# Patient Record
Sex: Male | Born: 1975 | Race: Black or African American | Hispanic: No | Marital: Single | Smoking: Current every day smoker
Health system: Southern US, Community
[De-identification: ages and names within clinical notes are randomized; demographics above are authoritative.]

## PROBLEM LIST (undated history)

## (undated) HISTORY — PX: SHOULDER SURGERY: SHX246

---

## 1998-12-27 ENCOUNTER — Emergency Department (HOSPITAL_COMMUNITY): Admission: EM | Admit: 1998-12-27 | Discharge: 1998-12-27 | Payer: Self-pay | Admitting: Emergency Medicine

## 2004-01-30 ENCOUNTER — Emergency Department (HOSPITAL_COMMUNITY): Admission: EM | Admit: 2004-01-30 | Discharge: 2004-01-30 | Payer: Self-pay | Admitting: Emergency Medicine

## 2004-03-14 ENCOUNTER — Emergency Department (HOSPITAL_COMMUNITY): Admission: EM | Admit: 2004-03-14 | Discharge: 2004-03-14 | Payer: Self-pay | Admitting: *Deleted

## 2004-05-06 ENCOUNTER — Emergency Department (HOSPITAL_COMMUNITY): Admission: EM | Admit: 2004-05-06 | Discharge: 2004-05-06 | Payer: Self-pay | Admitting: Family Medicine

## 2005-01-29 ENCOUNTER — Emergency Department (HOSPITAL_COMMUNITY): Admission: EM | Admit: 2005-01-29 | Discharge: 2005-01-29 | Payer: Self-pay | Admitting: Emergency Medicine

## 2006-03-19 ENCOUNTER — Emergency Department (HOSPITAL_COMMUNITY): Admission: EM | Admit: 2006-03-19 | Discharge: 2006-03-19 | Payer: Self-pay | Admitting: Family Medicine

## 2006-05-07 ENCOUNTER — Emergency Department (HOSPITAL_COMMUNITY): Admission: EM | Admit: 2006-05-07 | Discharge: 2006-05-07 | Payer: Self-pay | Admitting: Family Medicine

## 2007-07-15 ENCOUNTER — Emergency Department (HOSPITAL_COMMUNITY): Admission: EM | Admit: 2007-07-15 | Discharge: 2007-07-15 | Payer: Self-pay | Admitting: Emergency Medicine

## 2008-05-24 ENCOUNTER — Emergency Department (HOSPITAL_COMMUNITY): Admission: EM | Admit: 2008-05-24 | Discharge: 2008-05-24 | Payer: Self-pay | Admitting: Family Medicine

## 2008-10-20 ENCOUNTER — Emergency Department (HOSPITAL_COMMUNITY): Admission: EM | Admit: 2008-10-20 | Discharge: 2008-10-20 | Payer: Self-pay | Admitting: Emergency Medicine

## 2008-12-06 ENCOUNTER — Emergency Department (HOSPITAL_COMMUNITY): Admission: EM | Admit: 2008-12-06 | Discharge: 2008-12-06 | Payer: Self-pay | Admitting: Family Medicine

## 2009-01-01 ENCOUNTER — Emergency Department (HOSPITAL_COMMUNITY): Admission: EM | Admit: 2009-01-01 | Discharge: 2009-01-01 | Payer: Self-pay | Admitting: Emergency Medicine

## 2009-01-25 ENCOUNTER — Emergency Department (HOSPITAL_COMMUNITY): Admission: EM | Admit: 2009-01-25 | Discharge: 2009-01-25 | Payer: Self-pay | Admitting: Emergency Medicine

## 2009-01-25 ENCOUNTER — Emergency Department (HOSPITAL_COMMUNITY): Admission: EM | Admit: 2009-01-25 | Discharge: 2009-01-25 | Payer: Self-pay | Admitting: Family Medicine

## 2009-03-14 ENCOUNTER — Emergency Department (HOSPITAL_COMMUNITY): Admission: EM | Admit: 2009-03-14 | Discharge: 2009-03-14 | Payer: Self-pay | Admitting: Family Medicine

## 2009-09-23 ENCOUNTER — Emergency Department (HOSPITAL_COMMUNITY): Admission: EM | Admit: 2009-09-23 | Discharge: 2009-09-23 | Payer: Self-pay | Admitting: Family Medicine

## 2009-10-03 ENCOUNTER — Emergency Department (HOSPITAL_COMMUNITY): Admission: EM | Admit: 2009-10-03 | Discharge: 2009-10-03 | Payer: Self-pay | Admitting: Emergency Medicine

## 2010-08-30 ENCOUNTER — Emergency Department (HOSPITAL_COMMUNITY)
Admission: EM | Admit: 2010-08-30 | Discharge: 2010-08-30 | Payer: Self-pay | Source: Home / Self Care | Admitting: Emergency Medicine

## 2010-10-14 ENCOUNTER — Emergency Department (HOSPITAL_COMMUNITY)
Admission: EM | Admit: 2010-10-14 | Discharge: 2010-10-14 | Disposition: A | Discharge status: Home / Self Care | Payer: Self-pay | Attending: Emergency Medicine | Admitting: Emergency Medicine

## 2010-10-14 DIAGNOSIS — K089 Disorder of teeth and supporting structures, unspecified: Secondary | ICD-10-CM | POA: Insufficient documentation

## 2010-10-17 ENCOUNTER — Emergency Department (HOSPITAL_COMMUNITY)
Admission: EM | Admit: 2010-10-17 | Discharge: 2010-10-18 | Disposition: A | Discharge status: Home / Self Care | Payer: Self-pay | Attending: Emergency Medicine | Admitting: Emergency Medicine

## 2010-10-17 DIAGNOSIS — K047 Periapical abscess without sinus: Secondary | ICD-10-CM | POA: Insufficient documentation

## 2010-10-17 DIAGNOSIS — K089 Disorder of teeth and supporting structures, unspecified: Secondary | ICD-10-CM | POA: Insufficient documentation

## 2010-10-25 LAB — POCT URINALYSIS DIP (DEVICE)
Glucose, UA: NEGATIVE mg/dL
Nitrite: NEGATIVE

## 2010-12-01 ENCOUNTER — Emergency Department (HOSPITAL_COMMUNITY)
Admission: EM | Admit: 2010-12-01 | Discharge: 2010-12-01 | Disposition: A | Discharge status: Home / Self Care | Payer: Self-pay | Attending: Emergency Medicine | Admitting: Emergency Medicine

## 2010-12-01 DIAGNOSIS — R197 Diarrhea, unspecified: Secondary | ICD-10-CM | POA: Insufficient documentation

## 2010-12-01 DIAGNOSIS — R109 Unspecified abdominal pain: Secondary | ICD-10-CM | POA: Insufficient documentation

## 2010-12-01 DIAGNOSIS — R112 Nausea with vomiting, unspecified: Secondary | ICD-10-CM | POA: Insufficient documentation

## 2010-12-01 LAB — POCT I-STAT, CHEM 8
BUN: 24 mg/dL — ABNORMAL HIGH (ref 6–23)
Chloride: 108 mEq/L (ref 96–112)
Creatinine, Ser: 1 mg/dL (ref 0.4–1.5)
HCT: 49 % (ref 39.0–52.0)
Potassium: 4.7 mEq/L (ref 3.5–5.1)
TCO2: 21 mmol/L (ref 0–100)

## 2011-04-02 ENCOUNTER — Emergency Department (HOSPITAL_COMMUNITY)
Admission: EM | Admit: 2011-04-02 | Discharge: 2011-04-02 | Disposition: A | Discharge status: Home / Self Care | Payer: Self-pay | Attending: Emergency Medicine | Admitting: Emergency Medicine

## 2011-04-02 DIAGNOSIS — L02419 Cutaneous abscess of limb, unspecified: Secondary | ICD-10-CM | POA: Insufficient documentation

## 2011-04-02 DIAGNOSIS — M25469 Effusion, unspecified knee: Secondary | ICD-10-CM | POA: Insufficient documentation

## 2011-04-02 DIAGNOSIS — M25569 Pain in unspecified knee: Secondary | ICD-10-CM | POA: Insufficient documentation

## 2011-06-22 ENCOUNTER — Emergency Department (HOSPITAL_COMMUNITY)
Admission: EM | Admit: 2011-06-22 | Discharge: 2011-06-22 | Disposition: A | Discharge status: Home / Self Care | Payer: Self-pay | Attending: Emergency Medicine | Admitting: Emergency Medicine

## 2011-06-22 ENCOUNTER — Encounter: Payer: Self-pay | Admitting: *Deleted

## 2011-06-22 DIAGNOSIS — H9209 Otalgia, unspecified ear: Secondary | ICD-10-CM | POA: Insufficient documentation

## 2011-06-22 DIAGNOSIS — R599 Enlarged lymph nodes, unspecified: Secondary | ICD-10-CM | POA: Insufficient documentation

## 2011-06-22 DIAGNOSIS — R51 Headache: Secondary | ICD-10-CM | POA: Insufficient documentation

## 2011-06-22 DIAGNOSIS — H6693 Otitis media, unspecified, bilateral: Secondary | ICD-10-CM

## 2011-06-22 DIAGNOSIS — H6690 Otitis media, unspecified, unspecified ear: Secondary | ICD-10-CM | POA: Insufficient documentation

## 2011-06-22 DIAGNOSIS — R590 Localized enlarged lymph nodes: Secondary | ICD-10-CM

## 2011-06-22 DIAGNOSIS — J3489 Other specified disorders of nose and nasal sinuses: Secondary | ICD-10-CM | POA: Insufficient documentation

## 2011-06-22 MED ORDER — HYDROCODONE-ACETAMINOPHEN 5-325 MG PO TABS: 2.0000 | ORAL_TABLET | ORAL | Status: AC | PRN

## 2011-06-22 MED ORDER — IBUPROFEN 800 MG PO TABS
800.0000 mg | ORAL_TABLET | Freq: Once | ORAL | Status: AC
  Administered 2011-06-22: 800 mg via ORAL
  Filled 2011-06-22: qty 1

## 2011-06-22 MED ORDER — AMOXICILLIN 500 MG PO CAPS: 500.0000 mg | ORAL_CAPSULE | Freq: Three times a day (TID) | ORAL | Status: AC

## 2011-06-22 MED ORDER — IBUPROFEN 800 MG PO TABS: 800.0000 mg | ORAL_TABLET | Freq: Three times a day (TID) | ORAL | Status: AC | PRN

## 2011-06-22 MED ORDER — HYDROCODONE-ACETAMINOPHEN 5-325 MG PO TABS
2.0000 | ORAL_TABLET | Freq: Once | ORAL | Status: AC
  Administered 2011-06-22: 1 via ORAL
  Filled 2011-06-22 (×2): qty 1

## 2011-06-22 NOTE — ED Provider Notes (Signed)
History     CSN: [id number] Arrival date & time: 06/22/2011 10:07 AM   First MD Initiated Contact with Patient 06/22/11 1109      Chief Complaint  Patient presents with  . Headache    (Consider location/radiation/quality/duration/timing/severity/associated sxs/prior treatment) HPI Comments: The patient is a 35 year old male without significant history of headaches who presents for pain bilaterally at the occiput/posterior regular lymph nodes. He has apparent lymphadenopathy of the postauricular lymph nodes, and when I palpate the lymph nodes, he notes that that is the exact location of the pain and palpation reproduces pain. He has no associated muscle spasticity of the neck, no limited range of motion of the neck, no nuchal rigidity or meningismus. He notes that he has had an upper respiratory infection with nasal congestion and bilateral ear congestion with bilateral ear pain for the last 2 days associated with this pain. No difficulty in swallowing, no change in voice. He has no neurologic deficits including no ataxia, no focal numbness or weakness, no change in vision, no change in hearing or tinnitus. He has no rash. His scalp shows no lesions or sign of infection.  Patient is a 35 y.o. male presenting with headaches. The history is provided by the patient.  Headache  This is a new problem. The current episode started 2 days ago. The problem occurs every few minutes. The problem has not changed since onset.Associated with: Nasal congestion, bilateral ear congestion, bilateral ear pain. No sore throat. No scalp lesions. The patient has apparent swollen postauricular lymph nodes at the area of pain. He describes the pain as sharp twinges. The pain is located in the occipital region. The quality of the pain is described as sharp. The pain is moderate. The pain does not radiate. Pertinent negatives include no anorexia, no fever, no malaise/fatigue, no chest pressure, no near-syncope, no  orthopnea, no palpitations, no syncope, no shortness of breath, no nausea and no vomiting. He has tried nothing for the symptoms.    History reviewed. No pertinent past medical history.  History reviewed. No pertinent past surgical history.  History reviewed. No pertinent family history.  History  Substance Use Topics  . Smoking status: Current Everyday Smoker    Types: Cigarettes  . Smokeless tobacco: Not on file  . Alcohol Use: Yes     occ      Review of Systems  Constitutional: Negative for fever, chills, malaise/fatigue, diaphoresis, activity change, appetite change, fatigue and unexpected weight change.  HENT: Positive for ear pain, congestion, rhinorrhea and postnasal drip. Negative for hearing loss, nosebleeds, sore throat, facial swelling, sneezing, mouth sores, trouble swallowing, neck pain, neck stiffness, dental problem, sinus pressure, tinnitus and ear discharge.   Eyes: Negative for photophobia, pain, discharge, redness, itching and visual disturbance.  Respiratory: Negative for cough, chest tightness and shortness of breath.   Cardiovascular: Negative for chest pain, palpitations, orthopnea, syncope and near-syncope.  Gastrointestinal: Negative for nausea, vomiting, abdominal pain, diarrhea and anorexia.  Genitourinary: Negative.   Musculoskeletal: Negative for myalgias and back pain.  Skin: Negative for color change, pallor, rash and wound.  Neurological: Positive for headaches. Negative for dizziness, tremors, seizures, syncope, facial asymmetry, speech difficulty, weakness, light-headedness and numbness.  Hematological: Positive for adenopathy. Does not bruise/bleed easily.       Posterior auricular lymphadenopathy  Psychiatric/Behavioral: Negative.     Allergies  Review of patient's allergies indicates no known allergies.  Home Medications   Current Outpatient Rx  Name Route Sig Dispense Refill  .  ACETAMINOPHEN 500 MG PO TABS Oral Take 1,000 mg by mouth  every 6 (six) hours as needed. As needed for pain.       BP 123/86  Pulse 64  Temp 98.1 F (36.7 C)  Resp 15  SpO2 95%  Physical Exam  Nursing note and vitals reviewed. Constitutional: He is oriented to person, place, and time. He appears well-developed and well-nourished. He appears distressed.  HENT:  Head: Normocephalic and atraumatic. Head is without raccoon's eyes, without Battle's sign, without abrasion, without contusion, without right periorbital erythema and without left periorbital erythema.  Right Ear: Hearing, external ear and ear canal normal. No drainage, swelling or tenderness. No foreign bodies. No mastoid tenderness. Tympanic membrane is injected and erythematous. Tympanic membrane is not scarred and not perforated. A middle ear effusion is present. No hemotympanum. No decreased hearing is noted.  Left Ear: Hearing, external ear and ear canal normal. No drainage, swelling or tenderness. No foreign bodies. No mastoid tenderness. Tympanic membrane is injected and erythematous. Tympanic membrane is not scarred and not perforated. A middle ear effusion is present. No hemotympanum. No decreased hearing is noted.  Nose: No mucosal edema, rhinorrhea, sinus tenderness, nasal deformity, septal deviation or nasal septal hematoma. No epistaxis. Right sinus exhibits no maxillary sinus tenderness and no frontal sinus tenderness. Left sinus exhibits no maxillary sinus tenderness and no frontal sinus tenderness.  Mouth/Throat: Uvula is midline, oropharynx is clear and moist and mucous membranes are normal. No oropharyngeal exudate.       Bilateral postauricular lymphadenopathy  Eyes: Conjunctivae, EOM and lids are normal. Pupils are equal, round, and reactive to light. Right eye exhibits no chemosis, no discharge, no exudate and no hordeolum. No foreign body present in the right eye. Left eye exhibits no chemosis, no discharge, no exudate and no hordeolum. No foreign body present in the left  eye. Right conjunctiva is not injected. Right conjunctiva has no hemorrhage. Left conjunctiva is not injected. Left conjunctiva has no hemorrhage. Right eye exhibits normal extraocular motion and no nystagmus. Left eye exhibits normal extraocular motion and no nystagmus.  Fundoscopic exam:      The right eye shows no AV nicking, no exudate, no hemorrhage and no papilledema.       The left eye shows no AV nicking, no exudate, no hemorrhage and no papilledema.  Neck: Trachea normal, normal range of motion, full passive range of motion without pain and phonation normal. Neck supple. No JVD present. No tracheal tenderness, no spinous process tenderness and no muscular tenderness present. Carotid bruit is not present. No rigidity. No tracheal deviation, no edema, no erythema and normal range of motion present. No Brudzinski's sign noted. No mass and no thyromegaly present.  Cardiovascular: Normal rate, regular rhythm, normal heart sounds and intact distal pulses.  Exam reveals no gallop and no friction rub.   No murmur heard. Pulmonary/Chest: Effort normal and breath sounds normal. No stridor. No respiratory distress. He has no wheezes. He has no rales.  Abdominal: Soft. Bowel sounds are normal. He exhibits no distension and no mass. There is no tenderness. There is no rebound and no guarding.  Musculoskeletal: Normal range of motion. He exhibits no edema and no tenderness.  Neurological: He is alert and oriented to person, place, and time. He has normal reflexes. He displays no atrophy, no tremor and normal reflexes. No cranial nerve deficit or sensory deficit. He exhibits normal muscle tone. He displays a negative Romberg sign. He displays no seizure activity. Coordination  and gait normal. GCS eye subscore is 4. GCS verbal subscore is 5. GCS motor subscore is 6.  Reflex Scores:      Tricep reflexes are 2+ on the right side and 2+ on the left side.      Bicep reflexes are 2+ on the right side and 2+ on the  left side.      Brachioradialis reflexes are 2+ on the right side and 2+ on the left side.      Patellar reflexes are 2+ on the right side and 2+ on the left side.      Achilles reflexes are 2+ on the right side and 2+ on the left side.      Normal visual fields to confrontation, normal coordination left finger-nose-finger and heel-to-shin test, normal gait, no cerebellar signs, nonfocal neurologic exam.  Skin: Skin is warm and dry. No rash noted. He is not diaphoretic. No erythema. No pallor.  Psychiatric: He has a normal mood and affect. His behavior is normal. Judgment and thought content normal.    ED Course  Procedures (including critical care time)  Labs Reviewed - No data to display No results found.   No diagnosis found.    MDM  The patient appears to be having bilateral otitis media with bilateral postauricular lymphadenopathy as the cause of his pain. There is no sign of mastoiditis, no sign of cervical spasm, and a nonfocal neurologic exam. CT scan of the brain is not indicated at this time.        Felisa Bonier, MD 06/22/11 [phone]

## 2011-06-22 NOTE — ED Notes (Signed)
Headache x 3 days, no vomiting.

## 2011-11-28 ENCOUNTER — Emergency Department (HOSPITAL_COMMUNITY)
Admission: EM | Admit: 2011-11-28 | Discharge: 2011-11-28 | Disposition: A | Discharge status: Home / Self Care | Payer: Self-pay | Source: Home / Self Care | Attending: Emergency Medicine | Admitting: Emergency Medicine

## 2011-11-28 ENCOUNTER — Encounter (HOSPITAL_COMMUNITY): Payer: Self-pay | Admitting: *Deleted

## 2011-11-28 DIAGNOSIS — T7840XA Allergy, unspecified, initial encounter: Secondary | ICD-10-CM

## 2011-11-28 MED ORDER — EPINEPHRINE 0.3 MG/0.3ML IJ DEVI: 0.3000 mg | Freq: Once | INTRAMUSCULAR | Status: AC

## 2011-11-28 MED ORDER — DIPHENHYDRAMINE HCL 50 MG/ML IJ SOLN
25.0000 mg | Freq: Once | INTRAMUSCULAR | Status: AC
  Administered 2011-11-28: 25 mg via INTRAMUSCULAR

## 2011-11-28 MED ORDER — ONDANSETRON 4 MG PO TBDP
4.0000 mg | ORAL_TABLET | Freq: Once | ORAL | Status: AC
  Administered 2011-11-28: 4 mg via ORAL

## 2011-11-28 MED ORDER — ONDANSETRON 4 MG PO TBDP
ORAL_TABLET | ORAL | Status: AC
  Filled 2011-11-28: qty 1

## 2011-11-28 MED ORDER — DIPHENHYDRAMINE HCL 50 MG/ML IJ SOLN
INTRAMUSCULAR | Status: AC
  Filled 2011-11-28: qty 1

## 2011-11-28 MED ORDER — PREDNISONE 5 MG PO KIT: 1.0000 | PACK | Freq: Every day | ORAL | Status: DC

## 2011-11-28 MED ORDER — METHYLPREDNISOLONE SODIUM SUCC 125 MG IJ SOLR
INTRAMUSCULAR | Status: AC
  Filled 2011-11-28: qty 2

## 2011-11-28 MED ORDER — METHYLPREDNISOLONE SODIUM SUCC 125 MG IJ SOLR
125.0000 mg | Freq: Once | INTRAMUSCULAR | Status: AC
  Administered 2011-11-28: 125 mg via INTRAMUSCULAR

## 2011-11-28 NOTE — ED Notes (Signed)
Pt with c/o allergic reaction onset approx 45 min ago immediately after eating granola bar - no known allergies - lips swollen - rash and itching all over - resp even and unlabored -

## 2011-11-28 NOTE — Discharge Instructions (Signed)
Do not eat peanuts or peanut containing products again--ever.  Food Allergy A food allergy occurs from eating something you are sensitive to. Food allergies occur in all age groups. It may be passed to you from your parents (heredity).  CAUSES  Some common causes are cow's milk, seafood, eggs, nuts (including peanut butter), wheat, and soybeans. SYMPTOMS  Common problems are:   Swelling around the mouth.   An itchy, red rash.   Hives.   Vomiting.   Diarrhea.  Severe allergic reactions are life-threatening. This reaction is called anaphylaxis. It can cause the mouth and throat to swell. This makes it hard to breathe and swallow. In severe reactions, only a small amount of food may be fatal within seconds. HOME CARE INSTRUCTIONS   If you are unsure what caused the reaction, keep a diary of foods eaten and symptoms that followed. Avoid foods that cause reactions.   If hives or rash are present:   Take medicines as directed.   Use an over-the-counter antihistamine (diphenhydramine) to treat hives and itching as needed.   Apply cold compresses to the skin or take baths in cool water. Avoid hot baths or showers. These will increase the redness and itching.   If you are severely allergic:   Hospitalization is often required following a severe reaction.   Wear a medical alert bracelet or necklace that describes the allergy.   Carry your anaphylaxis kit or epinephrine injection with you at all times. Both you and your family members should know how to use this. This can be lifesaving if you have a severe reaction. If epinephrine is used, it is important for you to seek immediate medical care or call your local emergency services (911 in [place] When the epinephrine wears off, it can be followed by a delayed reaction, which can be fatal.   Replace your epinephrine immediately after use in case of another reaction.   Ask your caregiver for instructions if you have not been taught how to  use an epinephrine injection.   Do not drive until medicines used to treat the reaction have worn off, unless approved by your caregiver.  SEEK MEDICAL CARE IF:   You suspect a food allergy. Symptoms generally happen within 30 minutes of eating a food.   Your symptoms have not gone away within 2 days. See your caregiver sooner if symptoms are getting worse.   You develop new symptoms.   You want to retest yourself with a food or drink you think causes an allergic reaction. Never do this if an anaphylactic reaction to that food or drink has happened before.   There is a return of the symptoms which brought you to your caregiver.  SEEK IMMEDIATE MEDICAL CARE IF:   You have trouble breathing, are wheezing, or you have a tight feeling in your chest or throat.   You have a swollen mouth, or you have hives, swelling, or itching all over your body. Use your epinephrine injection immediately. This is given into the outside of your thigh, deep into the muscle. Following use of the epinephrine injection, seek help right away.  Seek immediate medical care or call your local emergency services (911 in [place] MAKE SURE YOU:   Understand these instructions.   Will watch your condition.   Will get help right away if you are not doing well or get worse.  Document Released: 07/20/2000 Document Revised: 07/12/2011 Document Reviewed: 03/11/2008 [facility] Patient Information 2012 [place], [place].

## 2011-11-28 NOTE — ED Notes (Signed)
Pt up to bathroom gait steady feels better

## 2011-11-28 NOTE — ED Notes (Signed)
Family member with pt at bedside  Pt feeling better redness resolving itching resolving skin warm and dry

## 2011-11-28 NOTE — ED Notes (Signed)
HOB elevated pt feeling better gingerale given

## 2011-11-28 NOTE — ED Notes (Signed)
Pt became diaphoretic after injections - felt light headed - head of bed down feet up - bp 92/70 second bp 110/73

## 2011-11-28 NOTE — ED Notes (Signed)
Gave Pt Sm ice pack to place on injection site per [employer]

## 2011-11-28 NOTE — ED Provider Notes (Signed)
Chief Complaint  Patient presents with  . Allergic Reaction  . Rash  . Pruritis    History of Present Illness:   Rodney Hopkins is a 36 year old male who was well up until this afternoon around 2:25 PM when he ate a granola bar. Within minutes he broke out in a generalized erythematous rash, swelling of his face, generalized itching, he felt dizzy and weak. He denies any difficulty breathing, swelling of the tongue, the throat, wheezing, or shortness of breath. He's never had this happen to him before and he has no known allergies. His son has a peanut allergy and has similar symptoms when he eats peanuts.  Review of Systems:  Other than noted above, the patient denies any of the following symptoms. Systemic:  No fever, chills, sweats, fatigue, myalgias, headache, or anorexia. Eye:  No redness, itching, watering, pain or drainage. ENT:  No earache, ear congestion, nasal congestion, sneezing, nasal itching, rhinorrhea, sinus pressure, sinus pain, post nasal drip, or sore throat. Lungs:  No cough, sputum production, wheezing, shortness of breath, or chest pain. Skin:  No rash or itching.  PMFSH:  Past medical history, family history, social history, meds, and allergies were reviewed.  No history of asthma. No use of tobacco.  Physical Exam:   Vital signs:  BP 129/85  Pulse 89  Temp(Src) 98.1 F (36.7 C) (Oral)  Resp 16  SpO2 97% General:  Alert, in no respiratory distress. His arms are diffusely erythematous, his face with some swelling of his lips and around his left eye. There was no urticaria. Eye:  No conjunctival injection or drainage. Lids were normal. ENT:  TMs and canals were normal, without erythema or inflammation. Mucous membranes were moist.  Pharynx was clear, without exudate or drainage.  There were no oral ulcerations or lesions. Neck:  Supple, no adenopathy, tenderness or mass. Lungs:  No respiratory distress.  Lungs were clear to auscultation, without wheezes, rales or  rhonchi.  Breath sounds were clear and equal bilaterally. Heart:  Regular rhythm, without gallops, murmers or rubs. Skin:  Clear, warm, and dry, without rash or lesions. He has the erythematous rash as described above.  Course in Urgent Care Center:   He was given Benadryl 25 mg IM and Solu-Medrol 125 mg IM. Thereafter he had a vasovagal episode of presyncope. He described severe pain at the site of the Solu-Medrol injection. His allergic symptoms cleared up. His rash went away completely and for a while he did have a little bit of erythema of his arms after that but he had no further itching and no further facial swelling. He has not had any difficulty breathing.  Assessment:  The encounter diagnosis was Anaphylactic reaction due to food. This is most likely due to peanuts.  Plan:   1.  The following meds were prescribed:   New Prescriptions   EPINEPHRINE (EPI-PEN) 0.3 MG/0.3 ML DEVI    Inject 0.3 mLs (0.3 mg total) into the muscle once.   PREDNISONE 5 MG KIT    Take 1 kit (5 mg total) by mouth daily after breakfast. Prednisone 5 mg 6 day dosepack.  Take as directed.   2.  The patient was instructed in symptomatic care and handouts were given. 3.  The patient was told to return if becoming worse in any way, if no better in 3 or 4 days, and given some red flag symptoms that would indicate earlier return. The patient was told not seen any peanuts or peanuts containing foods or  products again ever. He was told to take Benadryl 25 mg every 4 hours while awake for the next 24 hours.  Follow up:  The patient was told to follow up here if any further problems occur.    Rodney Likes, MD 11/28/11 [phone]

## 2013-11-18 ENCOUNTER — Emergency Department (HOSPITAL_COMMUNITY)
Admission: EM | Admit: 2013-11-18 | Discharge: 2013-11-18 | Disposition: A | Discharge status: Home / Self Care | Payer: Self-pay | Attending: Emergency Medicine | Admitting: Emergency Medicine

## 2013-11-18 ENCOUNTER — Encounter (HOSPITAL_COMMUNITY): Payer: Self-pay | Admitting: Emergency Medicine

## 2013-11-18 DIAGNOSIS — F17200 Nicotine dependence, unspecified, uncomplicated: Secondary | ICD-10-CM | POA: Insufficient documentation

## 2013-11-18 DIAGNOSIS — K089 Disorder of teeth and supporting structures, unspecified: Secondary | ICD-10-CM | POA: Insufficient documentation

## 2013-11-18 DIAGNOSIS — K029 Dental caries, unspecified: Secondary | ICD-10-CM | POA: Insufficient documentation

## 2013-11-18 DIAGNOSIS — H9209 Otalgia, unspecified ear: Secondary | ICD-10-CM | POA: Insufficient documentation

## 2013-11-18 DIAGNOSIS — K0889 Other specified disorders of teeth and supporting structures: Secondary | ICD-10-CM

## 2013-11-18 MED ORDER — HYDROCODONE-ACETAMINOPHEN 5-325 MG PO TABS
1.0000 | ORAL_TABLET | Freq: Once | ORAL | Status: AC
  Administered 2013-11-18: 1 via ORAL
  Filled 2013-11-18: qty 1

## 2013-11-18 MED ORDER — HYDROCODONE-ACETAMINOPHEN 5-325 MG PO TABS: 1.0000 | ORAL_TABLET | Freq: Four times a day (QID) | ORAL | Status: DC | PRN

## 2013-11-18 MED ORDER — PENICILLIN V POTASSIUM 250 MG PO TABS
500.0000 mg | ORAL_TABLET | Freq: Four times a day (QID) | ORAL | Status: AC
Start: 2013-11-18 — End: 2013-11-25

## 2013-11-18 NOTE — ED Provider Notes (Signed)
CSN: [id number]     Arrival date & time 11/18/13  1333 History   First MD Initiated Contact with Patient 11/18/13 1544     Chief Complaint  Patient presents with  . Dental Pain     (Consider location/radiation/quality/duration/timing/severity/associated sxs/prior Treatment) The history is provided by the patient. No language interpreter was used.  Rodney Hopkins is a 38 y/o M with no significant PMHx presenting to the ED with dental pain that started yesterday. Patient reported that the pain is localized to the right lower jawline described as a constant throbbing sensation with radiation to the right ear. Patient reported that he has been using Tylenol and Tramadol with minimal relief. Reported that he has not been to the dentist in a while. Denied drainage, bleeding, fever, chills, numbness, tingling, visual changes, chest pain, shortness of breath, difficulty breathing, swelling, difficulty swallowing, sore throat.  PCP none   History reviewed. No pertinent past medical history. Past Surgical History  Procedure Laterality Date  . Shoulder surgery Left    No family history on file. History  Substance Use Topics  . Smoking status: Current Every Day Smoker  . Smokeless tobacco: Not on file  . Alcohol Use: No    Review of Systems  Constitutional: Negative for fever and chills.  HENT: Positive for dental problem and ear pain (right ear). Negative for trouble swallowing.   Eyes: Negative for visual disturbance.  Respiratory: Negative for chest tightness and shortness of breath.   Cardiovascular: Negative for chest pain.  Gastrointestinal: Negative for nausea and vomiting.  Musculoskeletal: Negative for neck pain.  Neurological: Negative for dizziness, speech difficulty, weakness and numbness.  All other systems reviewed and are negative.     Allergies  Review of patient's allergies indicates no known allergies.  Home Medications   Prior to Admission medications     Medication Sig Start Date End Date Taking? Authorizing Provider  acetaminophen (TYLENOL) 500 MG tablet Take 500 mg by mouth every 6 (six) hours as needed.   Yes Historical Provider, MD  HYDROcodone-acetaminophen (NORCO/VICODIN) 5-325 MG per tablet Take 1 tablet by mouth every 6 (six) hours as needed. 11/18/13   Brae Gartman, PA-C  penicillin v potassium (VEETID) 250 MG tablet Take 2 tablets (500 mg total) by mouth 4 (four) times daily. 11/18/13 11/25/13  Lyndel Sarate, PA-C   BP 141/113  Temp(Src) 97.9 F (36.6 C) (Oral)  Ht 5\' 7"  (1.702 m)  Wt 170 lb (77.111 kg)  BMI 26.62 kg/m2  SpO2 98% Physical Exam  Nursing note and vitals reviewed. Constitutional: He is oriented to person, place, and time. He appears well-developed and well-nourished. No distress.  HENT:  Head: Normocephalic and atraumatic.  Mouth/Throat: Oropharynx is clear and moist. No oral lesions. No trismus in the jaw. Dental caries present. No dental abscesses, uvula swelling or lacerations. No oropharyngeal exudate or tonsillar abscesses.    Poor dentition noted with numerous teeth diagrammed in missing. Numerous teeth in decaying process. Negative findings of periapical abscess. Negative trismus. Negative sublingual lesions. Uvula midline with symmetrical elevation.  Eyes: Conjunctivae and EOM are normal. Pupils are equal, round, and reactive to light. Right eye exhibits no discharge. Left eye exhibits no discharge.  Neck: Normal range of motion. Neck supple. No tracheal deviation present.  Cardiovascular: Normal rate, regular rhythm and normal heart sounds.  Exam reveals no friction rub.   No murmur heard. Pulmonary/Chest: Effort normal and breath sounds normal. No respiratory distress. He has no wheezes. He has no rales.  He exhibits no tenderness.  Musculoskeletal: Normal range of motion.  Full ROM to upper and lower extremities without difficulty noted, negative ataxia noted.  Lymphadenopathy:    He has no cervical  adenopathy.  Neurological: He is alert and oriented to person, place, and time. No cranial nerve deficit. He exhibits normal muscle tone. Coordination normal.  Cranial nerves III-XII grossly intact Strength 5+/5+ to upper and lower extremities bilaterally with resistance applied, equal distribution noted Negative facial drooping Negative slurred speech  Skin: Skin is warm and dry. No rash noted. He is not diaphoretic. No erythema.  Psychiatric: He has a normal mood and affect. His behavior is normal. Thought content normal.    ED Course  Procedures (including critical care time) Labs Review Labs Reviewed - No data to display  Imaging Review No results found.   EKG Interpretation None      MDM   Final diagnoses:  Pain, dental    Medications  HYDROcodone-acetaminophen (NORCO/VICODIN) 5-325 MG per tablet 1 tablet (not administered)   Filed Vitals:   11/18/13 1348  BP: 141/113  Temp: 97.9 F (36.6 C)  TempSrc: Oral  Height: 5\' 7"  (1.702 m)  Weight: 170 lb (77.111 kg)  SpO2: 98%   Patient presented to the ED with dental pain that started a couple of days ago localized to the right lower jawline scribed as a constant throbbing sensation with radiation to his right ear. Stated he's been using Tylenol and tramadol with minimal relief. Reported that he has not been to dentist for a while. Alert and oriented. GCS 15. Heart rate and rhythm normal. Lungs clear to auscultation to upper and lower lobes bilaterally-patient stable to speak in full sentences without difficulty, negative use of excess her muscles, negative stridor. Negative facial swelling identified. Poor dentition identified with numerous teeth in decaying and diagrammed. Most discomfort upon palpation to the right first molar. Negative swelling, erythema, drainage noted to the site. Negative trismus. Uvula midline with symmetrical elevation. Negative sublingual lesions. Doubt peritonsillar abscess. Doubt Ludwig's angina.  Doubt retropharyngeal abscess. Suspicion to be dental pain secondary to poor dentition. Pain medications administered in ED setting with relief. Patient stable, afebrile. Discharged patient. Discharge patient with antibiotics and pain medications as discussed course, cautious, disposal technique. Ref[facility] and dentist. Discussed with patient to closely monitor symptoms and if symptoms are to worsen or change to report back to the ED - strict return instructions given.  Patient agreed to plan of care, understood, all questions answered.     Raymon MuttonMarissa Deforrest Bogle, PA-C 11/18/13 1946

## 2013-11-18 NOTE — ED Notes (Signed)
Pt states he just noticed one of his molars was broken this morning and the right side of his face hurts.

## 2013-11-18 NOTE — ED Notes (Signed)
Pt states he used oragel and so the pain has eased up a little bit. States he is having sharp pains on the bottom of his jaw up through his face and head.

## 2013-11-18 NOTE — Discharge Instructions (Signed)
Please call your doctor for a followup appointment within 24-48 hours. When you talk to your doctor please let them know that you were seen in the emergency department and have them acquire all of your records so that they can discuss the findings with you and formulate a treatment plan to fully care for your new and ongoing problems. Please call and set-up an appointment with health and wellness center Please call and set -up an appointment with dentist to be re-assessed and evaluated Please take medications as prescribed - while on pain medications there is to be no drinking alcohol, driving, operating any heavy machinery. If extra please dispose in a proper manner Please do not take Tylenol for this can lead to tylenol overdose and liver failure Please continue to monitor symptoms closely and if symptoms are to worsen or change (fever greater than 101, chills, chest pain, shortness of breath, difficulty breathing, difficulty swallowing, inability to swallow, swelling to the face, bleeding, drainage, visual changes) please report back to the ED immediately   Dental Pain A tooth ache may be caused by cavities (tooth decay). Cavities expose the nerve of the tooth to air and hot or cold temperatures. It may come from an infection or abscess (also called a boil or furuncle) around your tooth. It is also often caused by dental caries (tooth decay). This causes the pain you are having. DIAGNOSIS  Your caregiver can diagnose this problem by exam. TREATMENT   If caused by an infection, it may be treated with medications which kill germs (antibiotics) and pain medications as prescribed by your caregiver. Take medications as directed.  Only take over-the-counter or prescription medicines for pain, discomfort, or fever as directed by your caregiver.  Whether the tooth ache today is caused by infection or dental disease, you should see your dentist as soon as possible for further care. SEEK MEDICAL CARE  IF: The exam and treatment you received today has been provided on an emergency basis only. This is not a substitute for complete medical or dental care. If your problem worsens or new problems (symptoms) appear, and you are unable to meet with your dentist, call or return to this location. SEEK IMMEDIATE MEDICAL CARE IF:   You have a fever.  You develop redness and swelling of your face, jaw, or neck.  You are unable to open your mouth.  You have severe pain uncontrolled by pain medicine. MAKE SURE YOU:   Understand these instructions.  Will watch your condition.  Will get help right away if you are not doing well or get worse. Document Released: 07/23/2005 Document Revised: 10/15/2011 Document Reviewed: 03/10/2008 [facility] Patient Information 2014 [place], [place].   Emergency Department Resource Guide 1) Find a Doctor and Pay Out of Pocket Although you won't have to find out who is covered by your insurance plan, it is a good idea to ask around and get recommendations. You will then need to call the office and see if the doctor you have chosen will accept you as a new patient and what types of options they offer for patients who are self-pay. Some doctors offer discounts or will set up payment plans for their patients who do not have insurance, but you will need to ask so you aren't surprised when you get to your appointment.  2) Contact Your Local Health Department Not all health departments have doctors that can see patients for sick visits, but many do, so it is worth a call to see if yours  does. If you don't know where your local health department is, you can check in your phone book. The CDC also has a tool to help you locate your state's health department, and many state websites also have listings of all of their local health departments.  3) Find a Walk-in Clinic If your illness is not likely to be very severe or complicated, you may want to try a walk in clinic. These are  popping up all over the country in pharmacies, drugstores, and shopping centers. They're usually staffed by nurse practitioners or physician assistants that have been trained to treat common illnesses and complaints. They're usually fairly quick and inexpensive. However, if you have serious medical issues or chronic medical problems, these are probably not your best option.  No Primary Care Doctor: - Call Health Connect at  [phone] - they can help you locate a primary care doctor that  accepts your insurance, provides certain services, etc. - Physician Referral Service- [phone]  Chronic Pain Problems: Organization         Address  Phone   Notes  [facility]  [phone] Patients need to be referred by their primary care doctor.   Medication Assistance: Organization         Address  Phone   Notes  [facility] Medication [facility] [address] [place] [place], [place] [address] [phone] --Must be a resident of [facility] -- Must have NO insurance coverage whatsoever (no Medicaid/ Medicare, etc.) -- The pt. MUST have a primary care doctor that directs their care regularly and follows them in the community   [facility]ist  [phone]   [employer]  [phone]    Agencies that provide inexpensive medical care: Organization         Address  Phone   Notes  [facility]  [phone]   [facility]    [phone]   [facility] [address] [place], [place] [address] [phone]   [facility] [address] [place] [place] [phone]   Planned Parenthood    [phone]   [facility]    [phone]   [facility] and [facility]  [address], [place] Phone:  [phone], Fax:  [phone] Hours of Operation:  9 am - 6 pm, M-F.  Also accepts Medicaid/Medicare and self-pay.  [facility]  [address], [place] Phone: [phone], Fax: [phone]. Hours of Operation:  8:30 am - 5:30 pm, M-F.  Also accepts Medicaid and self-pay.  [facility] [place] [address] [place] Phone: [phone]   [facility] [address] Natasha Bence [place], [place] [phone], Ext. [phone] Mondays & Thursdays: 7-9 AM.  First 15 patients are seen on a first come, first serve basis.    Medicaid-accepting [facility] Providers:  Organization         Address  Phone   Notes  [facility] [address],  [phone] Also accepts self-pay patients.  [facility] [address] Laurell Josephs [place], [place]  [phone]   [facility] [address], [place] [phone]   [facility] [address] [place] [phone]   Renaye Rakers [address], [place]   [phone]  Only accepts [place] Access Medicaid patients after they have their name applied to their card.   Self-Pay (no insurance) in [facility]:  Organization         Address  Phone   Notes  Sickle Cell Patients, [facility] [address], [place] [phone]) [phone]   [facility] [address], [place] [phone]   [facility]  [address], [place] [phone]) [phone]   [facility]/Dr. Osei-Bonsu  [address] [place] [address], [place] or [phone], [place] [phone]) [phone] Phone number for both [place] and [place] locations is the same.  [facility] [address], [place] [phone]) [phone]   [facility] [place] [address] [place] [address], [place] or [address] [phone]   [facility] [address], [place] [phone]) [phone], phone; [phone]) [phone], fax Sees patients 1st and 3rd Saturday  of every month.  Must not qualify for public or private insurance (i.e. Medicaid, Medicare, [place] Health Choice, Veterans' Benefits)  Household income should be no more than 200% of the poverty level The clinic cannot treat you if you are pregnant or think you are pregnant  Sexually transmitted diseases are not treated at the clinic.    Dental Care: Organization         Address  Phone  Notes  [facility] [address], [place] [phone]) [phone] Accepts children up to age 36 who are enrolled in [place] or [place] Health Choice; pregnant women with a Medicaid card; and children who have applied for Medicaid or [place] Health Choice, but were declined, whose parents can pay a reduced fee at time of service.  [facility] [place]  [address], [place] [phone]) [phone] Accepts children up to age 36 who are enrolled in [place] or [place] Health Choice; pregnant women with a Medicaid card; and children who have applied for Medicaid or [place] Health Choice, but were declined, whose parents can pay a reduced fee at time of service.  [facility]  [address], [place] [phone]) [phone] Patients are seen by appointment only. Walk-ins are not accepted. [facility] will see patients 36 years of age and older. Monday - Tuesday (8am-5pm) Most Wednesdays (8:30-5pm) $30 per visit, cash only  [facility]  [address], [place] [phone]) [phone] Patients are seen by appointment only. Walk-ins are not accepted. [facility] will see patients 36 years of age and older. One Wednesday Evening (Monthly: Volunteer Based).  $30 per visit, cash only  [facility]  [phone]) [phone] for adults; Children under age 654, call Graduate Pediatric Dentistry at [phone]) [phone]. Children aged 584-14, please call [phone] to request a pediatric application.   Dental services are provided in all areas of dental care including fillings, crowns and bridges, complete and partial dentures, implants, gum treatment, root canals, and extractions. Preventive care is also provided. Treatment is provided to both adults and children. Patients are selected via a lottery and there is often a waiting list.   [facility] [address], [place]  [phone]) [phone] [email or web address]   [facility] [address], [place], [place] [phone], Ext. [phone] Second and Fourth Thursday of each month, opens at 6:30 AM; Clinic  ends at 9 AM.  Patients are seen on a first-come first-served basis, and a limited number are seen during each clinic.   [facility]  [address], [place], [place] [phone](336) [phone]   Eligibility Requirements You must have lived in [place], [place], or [place] counties for at least the last three months.   You cannot be eligible for state or federal sponsored National Cityhealthcare insurance, including CIGNAVeterans Administration, [place]icaid, or [employer]Medicare.   You generally cannot be eligible for healthcare insurance through your employer.    How to apply: Eligibility screenings are held every Tuesday and Wednesday afternoon from 1:00 pm until 4:00 pm. You do not need an appointment for the interview!  [facility] [address], [place], [place] [phone]   [facility]  [phone]   [facility]  [phone]   [facility]  [phone]    Behavioral Health Resources in the Community: Intensive Outpatient Programs Organization         Address  Phone  Notes  [facility] [address], [place], [place] [phone]   [facility] [address], [place], [place] [phone]   [facility] [address], [place], [place]  [phone]   [facility] [address],  [place], [place] [phone] or [phone]   Substance Abuse Resources Organization         Address  Phone  Notes  [facility]  [phone]   [facility]  [phone]   [facility]  [phone]   [facility]  [phone]   Residential & Outpatient Substance Abuse Program  [phone]   Psychological Services Organization         Address  Phone  Notes  [facility]  [phone]- [phone]   [facility]  [phone]- [phone]   [facility] [address], [place] [phone] or [phone]    Mobile Crisis Teams Organization         Address  Phone  Notes  [facility], Mobile Crisis Care Unit  [phone]   [facility]  [address] [place], [place] [phone]   Doristine LocksSharon DeEsch [address] [place] [phone]    Self-Help/Support Groups Organization         Address  Phone             Notes  [facility] [place] - variety of support groups  336- [phone] Call for more information  Narcotics Anonymous (NA), [facility] [address], [place]  2 meetings at this location   Statisticianesidential Treatment Programs Organization         Address  Phone  Notes  [facility] [address],    [place]  [phone]   [facility]  [address], [place] [phone], [place], [place] [phone]   [facility] [address], [place] [phone] Admissions: 8am-3pm M-F  [facility] [address]    [place], [place] [phone]   [facility] [address], [place], [place] [phone]   [facility] [address]  [place], [place] [phone]   [facility] - Intensive Outpatient [address], [place], [place] [phone]   [facility] ([facility] [address]  [place], [place]  [phone] or [phone]  [facility] ([facility]) [address] [place], [place] [phone] Accepts Medicaid  °[facility] [address]  °[place] 1-800-659-3381 Substance Abuse/Addiction Treatment  ° °[place] Behavioral Health Resources °Organization         Address  Phone  Notes  °[facility]  ([phone]) [phone]   °Julie Brannon, PhD [address] [employer], [place]   ([phone]) [phone] or (336) [phone]   °[facility]   [address] °[place], [place] ([phone]) [phone]   °[facility] [address], [place], [place] [phone]) [phone] Insurance/Medicaid/sponsorship through [facility]  °[facility] [address]                                    [place], [place] [phone]) [phone] Therapy/tele-psych/case  °[facility] [address]  ° [place], [place] [phone]) [phone]    °Dr. Arfeen  ([phone]   °[facility] [place]  [employer] [place] [facility] 1) [address], [place] °2) [address], [place] °3)  [address], [place] ([phone]) [phone] °(336) [phone] ° °([phone]) [phone]   °[place] Child Abuse Hotline (336) 342-1394 or (336) [phone] (After Hours)    ° ° ° °

## 2013-11-19 NOTE — ED Provider Notes (Signed)
Medical screening examination/treatment/procedure(s) were performed by non-physician practitioner and as supervising physician I was immediately available for consultation/collaboration.   EKG Interpretation None        Lisette Mancebo L Lenoir Facchini, MD 11/19/13 1427 

## 2014-06-26 ENCOUNTER — Emergency Department (HOSPITAL_COMMUNITY)
Admission: EM | Admit: 2014-06-26 | Discharge: 2014-06-26 | Disposition: A | Discharge status: Home / Self Care | Payer: Self-pay | Attending: Emergency Medicine | Admitting: Emergency Medicine

## 2014-06-26 ENCOUNTER — Encounter (HOSPITAL_COMMUNITY): Payer: Self-pay | Admitting: Nurse Practitioner

## 2014-06-26 ENCOUNTER — Emergency Department (HOSPITAL_COMMUNITY): Payer: Self-pay

## 2014-06-26 DIAGNOSIS — M79672 Pain in left foot: Secondary | ICD-10-CM

## 2014-06-26 DIAGNOSIS — Y9367 Activity, basketball: Secondary | ICD-10-CM | POA: Insufficient documentation

## 2014-06-26 DIAGNOSIS — Y92310 Basketball court as the place of occurrence of the external cause: Secondary | ICD-10-CM | POA: Insufficient documentation

## 2014-06-26 DIAGNOSIS — M25472 Effusion, left ankle: Secondary | ICD-10-CM

## 2014-06-26 DIAGNOSIS — T1490XA Injury, unspecified, initial encounter: Secondary | ICD-10-CM

## 2014-06-26 DIAGNOSIS — X58XXXA Exposure to other specified factors, initial encounter: Secondary | ICD-10-CM | POA: Insufficient documentation

## 2014-06-26 DIAGNOSIS — M25572 Pain in left ankle and joints of left foot: Secondary | ICD-10-CM

## 2014-06-26 DIAGNOSIS — Y998 Other external cause status: Secondary | ICD-10-CM | POA: Insufficient documentation

## 2014-06-26 DIAGNOSIS — S92352A Displaced fracture of fifth metatarsal bone, left foot, initial encounter for closed fracture: Secondary | ICD-10-CM | POA: Insufficient documentation

## 2014-06-26 DIAGNOSIS — Z720 Tobacco use: Secondary | ICD-10-CM | POA: Insufficient documentation

## 2014-06-26 DIAGNOSIS — S92212A Displaced fracture of cuboid bone of left foot, initial encounter for closed fracture: Secondary | ICD-10-CM | POA: Insufficient documentation

## 2014-06-26 MED ORDER — IBUPROFEN 200 MG PO TABS
400.0000 mg | ORAL_TABLET | Freq: Once | ORAL | Status: AC
  Administered 2014-06-26: 400 mg via ORAL
  Filled 2014-06-26: qty 2

## 2014-06-26 MED ORDER — MELOXICAM 7.5 MG PO TABS
7.5000 mg | ORAL_TABLET | Freq: Every day | ORAL | Status: AC
Start: 2014-06-26 — End: ?

## 2014-06-26 MED ORDER — HYDROCODONE-ACETAMINOPHEN 5-325 MG PO TABS: 1.0000 | ORAL_TABLET | Freq: Four times a day (QID) | ORAL | Status: DC | PRN

## 2014-06-26 MED ORDER — HYDROCODONE-ACETAMINOPHEN 5-325 MG PO TABS
2.0000 | ORAL_TABLET | Freq: Once | ORAL | Status: AC
  Administered 2014-06-26: 2 via ORAL
  Filled 2014-06-26: qty 2

## 2014-06-26 NOTE — ED Notes (Signed)
He was playing basketball this afternoon and hurt L ankle. Hes had severe pain since. Swelling is noted, cms intact.

## 2014-06-26 NOTE — ED Provider Notes (Signed)
CSN: [id number]     Arrival date & time 06/26/14  1744 History  This chart was scribed for non-physician practitioner working with Rodney MochaBlair Walden, MD by Elveria Risingimelie Horne, ED Scribe. This patient was seen in room TR07C/TR07C and the patient's care was started at 7:26 PM.   Chief Complaint  Patient presents with  . Ankle Pain   The history is provided by the patient. No language interpreter was used.   HPI Comments: Rodney Hopkins is a 38 y.o. male who presents to the Emergency Department with left ankle injury incurred this afternoon. Patient reports everting his ankle while playing basketball. Patient presents with severe aching, throbbing pain and swelling to the lateral aspect. Patient reports shooting pain extending up to his left hip following the injury that has since resolved. Patient is ambulatory with difficulty due to pain severity rating his pain 10/10. Patient's pain exacerbated with movement, ambulation and bearing weight. Patient denies previous injury.  No pain medication PTA.   History reviewed. No pertinent past medical history. Past Surgical History  Procedure Laterality Date  . Shoulder surgery Left    History reviewed. No pertinent family history. History  Substance Use Topics  . Smoking status: Current Every Day Smoker  . Smokeless tobacco: Not on file  . Alcohol Use: Yes     Comment: social    Review of Systems  Constitutional: Negative for fever and chills.  Musculoskeletal: Positive for joint swelling and arthralgias. Negative for gait problem.  Neurological: Negative for weakness and numbness.      Allergies  Review of patient's allergies indicates no known allergies.  Home Medications   Prior to Admission medications   Medication Sig Start Date End Date Taking? Authorizing Provider  HYDROcodone-acetaminophen (NORCO/VICODIN) 5-325 MG per tablet Take 1-2 tablets by mouth every 6 (six) hours as needed. 06/26/14   Rodney FinnerErin O'Malley, PA-C  meloxicam (MOBIC) 7.5  MG tablet Take 1 tablet (7.5 mg total) by mouth daily. Take 1-2 tabs once daily for 5 days, then daily as needed for pain 06/26/14   Rodney FinnerErin O'Malley, PA-C   Triage Vitals: BP 145/89 mmHg  Pulse 87  Temp(Src) 98.3 F (36.8 C)  Resp 18  Ht 5\' 7"  (1.702 m)  Wt 160 lb (72.576 kg)  BMI 25.05 kg/m2  SpO2 98% Physical Exam  Constitutional: He is oriented to person, place, and time. He appears well-developed and well-nourished. No distress.  HENT:  Head: Normocephalic and atraumatic.  Eyes: EOM are normal.  Neck: Neck supple. No tracheal deviation present.  Cardiovascular: Normal rate.   Pulmonary/Chest: Effort normal. No respiratory distress.  Musculoskeletal: Normal range of motion.  Left ankle significant edema to lateral malleolus extending to fifth metatarsal. Tender to touch on lateral aspect of ankle and foot. Limited ROM of ankle due to pain. Full ROM of all five toes. Pedal pulses are 2+.   Neurological: He is alert and oriented to person, place, and time.  Skin: Skin is warm and dry.  Skin intact. No ecchymosis or erythema.   Psychiatric: He has a normal mood and affect. His behavior is normal.  Nursing note and vitals reviewed.   ED Course  Procedures (including critical care time)  COORDINATION OF CARE: 7:26 PM- Discussed treatment plan with patient at bedside and patient agreed to plan.   Labs Review Labs Reviewed - No data to display  Imaging Review Dg Ankle Complete Left  06/26/2014   CLINICAL DATA:  Past wall injury, twisting injury  EXAM: LEFT ANKLE COMPLETE -  3+ VIEW  COMPARISON:  None.  FINDINGS: Ankle mortise intact. The talar dome is normal. No malleolar fracture. The calcaneus is normal.  Potential avulsion fracture at the base of the fifth metatarsal.  IMPRESSION: 1. No evidence of ankle fracture. 2. Potential avulsion fracture at the base of the fifth metatarsal. Recommend foot films.   Electronically Signed   By: Genevive BiStewart  Edmunds M.D.   On: 06/26/2014 20:47    Dg Foot Complete Left  06/26/2014   CLINICAL DATA:  Patient's playing basketball lung rolled left ankle. Swelling laterally.  EXAM: LEFT FOOT - COMPLETE 3+ VIEW  COMPARISON:  None.  FINDINGS: Avulsion fracture fragment demonstrated adjacent to the cuboidal bone. No displaced fractures are demonstrated in the left foot. Mild lateral soft tissue swelling.  IMPRESSION: Avulsion fracture fragment demonstrated adjacent to the cuboidal bone.   Electronically Signed   By: Burman NievesWilliam  Stevens M.D.   On: 06/26/2014 21:19     EKG Interpretation None      MDM   Final diagnoses:  Left foot pain  Cuboid fracture, left, closed, initial encounter  Pain and swelling of ankle, left  Sports injury   Pt presenting to ED with left ankle and foot pain with swelling after twisting it playing basketball. Foot is neurovascularly in tact. Plain films significant for avulsion fracture demonstrated adjacent to cuboidal bone, potential avulsion fracture at the base of 5th metatarsal. Will place pt in cam-walker boot and provide crutches for comfort.  Home care instructions and work note provided. Advised to call to f/u with Dr. Eulah PontMurphy, orthopedics, for recheck and continued management of fracture if not improving in 1-2 weeks. Pt verbalized understanding and agreement with tx plan.  I personally performed the services described in this documentation, which was scribed in my presence. The recorded information has been reviewed and is accurate.    Rodney Finnerrin O'Malley, PA-C 06/26/14 2144  Rodney MochaBlair Walden, MD 06/26/14 2151

## 2017-08-26 ENCOUNTER — Encounter (HOSPITAL_COMMUNITY): Payer: Self-pay | Admitting: Nurse Practitioner

## 2019-01-10 ENCOUNTER — Emergency Department (HOSPITAL_COMMUNITY)
Admission: EM | Admit: 2019-01-10 | Discharge: 2019-01-10 | Disposition: A | Discharge status: Home / Self Care | Payer: Self-pay | Attending: Emergency Medicine | Admitting: Emergency Medicine

## 2019-01-10 ENCOUNTER — Other Ambulatory Visit: Payer: Self-pay

## 2019-01-10 ENCOUNTER — Encounter (HOSPITAL_COMMUNITY): Payer: Self-pay | Admitting: Emergency Medicine

## 2019-01-10 DIAGNOSIS — K047 Periapical abscess without sinus: Secondary | ICD-10-CM | POA: Insufficient documentation

## 2019-01-10 DIAGNOSIS — F17210 Nicotine dependence, cigarettes, uncomplicated: Secondary | ICD-10-CM | POA: Insufficient documentation

## 2019-01-10 DIAGNOSIS — K029 Dental caries, unspecified: Secondary | ICD-10-CM | POA: Insufficient documentation

## 2019-01-10 MED ORDER — AMOXICILLIN 500 MG PO CAPS
500.0000 mg | ORAL_CAPSULE | Freq: Once | ORAL | Status: AC
  Administered 2019-01-10: 500 mg via ORAL
  Filled 2019-01-10: qty 1

## 2019-01-10 MED ORDER — DICLOFENAC SODIUM 75 MG PO TBEC
75.0000 mg | DELAYED_RELEASE_TABLET | Freq: Once | ORAL | Status: AC
  Administered 2019-01-10: 75 mg via ORAL
  Filled 2019-01-10: qty 1

## 2019-01-10 MED ORDER — AMOXICILLIN 500 MG PO CAPS: 500.0000 mg | ORAL_CAPSULE | Freq: Three times a day (TID) | ORAL | 0 refills | Status: DC

## 2019-01-10 MED ORDER — DICLOFENAC SODIUM 75 MG PO TBEC: 75.0000 mg | DELAYED_RELEASE_TABLET | Freq: Two times a day (BID) | ORAL | 0 refills | Status: DC

## 2019-01-10 NOTE — Discharge Instructions (Addendum)
See the dentist as scheduled.  Return if any problems.

## 2019-01-10 NOTE — ED Provider Notes (Signed)
[facility] EMERGENCY DEPARTMENT Provider Note   CSN: [id number] Arrival date & time: 01/10/19  1722    History   Chief Complaint Chief Complaint  Patient presents with  . Oral Swelling    HPI Rodney Hopkins is a 43 y.o. male.     The history is provided by the patient. No language interpreter was used.  Dental Pain  Location:  Lower and upper Severity:  Moderate Onset quality:  Gradual Timing:  Constant Progression:  Worsening Chronicity:  New Relieved by:  Nothing Worsened by:  Nothing Ineffective treatments:  None tried Pt reports he has multiple bad teeth.  Pt complains of pain to the right side of his mouth   History reviewed. No pertinent past medical history.  There are no active problems to display for this patient.   Past Surgical History:  Procedure Laterality Date  . SHOULDER SURGERY Left         Home Medications    Prior to Admission medications   Medication Sig Start Date End Date Taking? Authorizing Provider  acetaminophen (TYLENOL) 500 MG tablet Take 1,000 mg by mouth every 6 (six) hours as needed. As needed for pain.     [provider]  amoxicillin (AMOXIL) 500 MG capsule Take 1 capsule (500 mg total) by mouth 3 (three) times daily. 01/10/19   Fransico Meadow, PA-C  diclofenac (VOLTAREN) 75 MG EC tablet Take 1 tablet (75 mg total) by mouth 2 (two) times daily. 01/10/19   Fransico Meadow, PA-C  EPINEPHrine (EPI-PEN) 0.3 mg/0.3 mL DEVI Inject 0.3 mLs (0.3 mg total) into the muscle once. 11/28/11   Harden Mo, MD  HYDROcodone-acetaminophen (NORCO/VICODIN) 5-325 MG per tablet Take 1-2 tablets by mouth every 6 (six) hours as needed. 06/26/14   Noe Gens, PA-C  meloxicam (MOBIC) 7.5 MG tablet Take 1 tablet (7.5 mg total) by mouth daily. Take 1-2 tabs once daily for 5 days, then daily as needed for pain 06/26/14   Noe Gens, PA-C  PredniSONE 5 MG KIT Take 1 kit (5 mg total) by mouth daily after breakfast.  Prednisone 5 mg 6 day dosepack.  Take as directed. 11/28/11   Harden Mo, MD    Family History History reviewed. No pertinent family history.  Social History Social History   Tobacco Use  . Smoking status: Current Every Day Smoker    Types: Cigarettes  Substance Use Topics  . Alcohol use: Yes    Comment: social  . Drug use: No     Allergies   Patient has no known allergies.   Review of Systems Review of Systems  HENT: Positive for dental problem.   All other systems reviewed and are negative.    Physical Exam Updated Vital Signs BP (!) 171/113 (BP Location: Right Arm)   Pulse 97   Temp 98 F (36.7 C) (Oral)   Resp 18   Ht _0  (1.676 m)   Wt 81.6 kg   SpO2 99%   BMI 29.05 kg/m   Physical Exam Vitals signs and nursing note reviewed.  HENT:     Head: Normocephalic.     Nose: Nose normal.     Mouth/Throat:     Comments: Severe dental decay,   Eyes:     Pupils: Pupils are equal, round, and reactive to light.  Neck:     Musculoskeletal: Normal range of motion.  Cardiovascular:     Rate and Rhythm: Normal rate.  Musculoskeletal: Normal  range of motion.  Skin:    General: Skin is warm.  Neurological:     General: No focal deficit present.     Mental Status: He is alert.  Psychiatric:        Mood and Affect: Mood normal.      ED Treatments / Results  Labs (all labs ordered are listed, but only abnormal results are displayed) Labs Reviewed - No data to display  EKG None  Radiology No results found.  Procedures Procedures (including critical care time)  Medications Ordered in ED Medications - No data to display   Initial Impression / Assessment and Plan / ED Course  I have reviewed the triage vital signs and the nursing notes.  Pertinent labs & imaging results that were available during my care of the patient were reviewed by me and considered in my medical decision making (see chart for details).        MDM   Pt reports he has  an appointment to see dentist.  Pt given rx for Voltaren and Amoxicillian   Final Clinical Impressions(s) / ED Diagnoses   Final diagnoses:  Dental infection    ED Discharge Orders         Ordered    amoxicillin (AMOXIL) 500 MG capsule  3 times daily     01/10/19 1801    diclofenac (VOLTAREN) 75 MG EC tablet  2 times daily     01/10/19 1801        An After Visit Summary was printed and given to the patient.    Fransico Meadow, [place] 01/10/19 1807    Little, Wenda Overland, MD 01/10/19 2125

## 2019-01-10 NOTE — ED Triage Notes (Signed)
Complaints of worsening dental pain x2 weeks. Pain is on upper and lower right side, radiating to right ear. Taking Tylenol, BC powders and Aleve with no relief. Denies fever/chills

## 2020-03-29 ENCOUNTER — Ambulatory Visit: Payer: Self-pay | Attending: Internal Medicine

## 2020-03-29 DIAGNOSIS — Z23 Encounter for immunization: Secondary | ICD-10-CM

## 2020-03-29 NOTE — Progress Notes (Signed)
   Covid-19 Vaccination Clinic  Name:  DORION PETILLO    MRN: [id number] DOB: [birth year]  03/29/2020  Mr. Drummonds was observed post Covid-19 immunization for 15 minutes without incident. He was provided with Vaccine Information Sheet and instruction to access the V-Safe system.   Mr. Mallozzi was instructed to call 911 with any severe reactions post vaccine: Marland Kitchen Difficulty breathing  . Swelling of face and throat  . A fast heartbeat  . A bad rash all over body  . Dizziness and weakness   Immunizations Administered    Name Date Dose VIS Date Route   Pfizer COVID-19 Vaccine 03/29/2020  2:17 PM 0.3 mL 09/30/2018 Intramuscular   Manufacturer: [employer], [employer]   Lot: [id number]   NDC: [address]-6

## 2020-04-26 ENCOUNTER — Ambulatory Visit: Payer: Self-pay

## 2020-06-18 ENCOUNTER — Ambulatory Visit: Payer: Self-pay

## 2020-11-10 ENCOUNTER — Emergency Department (HOSPITAL_COMMUNITY)
Admission: EM | Admit: 2020-11-10 | Discharge: 2020-11-10 | Disposition: A | Discharge status: Home / Self Care | Payer: Self-pay | Attending: Emergency Medicine | Admitting: Emergency Medicine

## 2020-11-10 ENCOUNTER — Emergency Department (HOSPITAL_COMMUNITY): Payer: Self-pay

## 2020-11-10 ENCOUNTER — Other Ambulatory Visit: Payer: Self-pay

## 2020-11-10 DIAGNOSIS — X58XXXA Exposure to other specified factors, initial encounter: Secondary | ICD-10-CM | POA: Insufficient documentation

## 2020-11-10 DIAGNOSIS — M25571 Pain in right ankle and joints of right foot: Secondary | ICD-10-CM | POA: Insufficient documentation

## 2020-11-10 DIAGNOSIS — Y9367 Activity, basketball: Secondary | ICD-10-CM | POA: Insufficient documentation

## 2020-11-10 DIAGNOSIS — F17210 Nicotine dependence, cigarettes, uncomplicated: Secondary | ICD-10-CM | POA: Insufficient documentation

## 2020-11-10 MED ORDER — KETOROLAC TROMETHAMINE 15 MG/ML IJ SOLN
15.0000 mg | Freq: Once | INTRAMUSCULAR | Status: AC
  Administered 2020-11-10: 15 mg via INTRAVENOUS
  Filled 2020-11-10: qty 1

## 2020-11-10 MED ORDER — ACETAMINOPHEN 500 MG PO TABS
1000.0000 mg | ORAL_TABLET | Freq: Once | ORAL | Status: AC
  Administered 2020-11-10: 1000 mg via ORAL
  Filled 2020-11-10: qty 2

## 2020-11-10 NOTE — ED Triage Notes (Signed)
Pt reports R ankle pain after injuring it while playing basketball yesterday afternoon after work.

## 2020-11-10 NOTE — ED Provider Notes (Signed)
MSE was initiated and I personally evaluated the patient and placed orders (if any) at  2:37 PM on November 10, 2020.  The patient appears stable so that the remainder of the MSE may be completed by another provider.   Anselm Pancoast, PA-C 11/10/20 1437    Gerhard Munch, MD 11/11/20 [phone]

## 2020-11-10 NOTE — ED Provider Notes (Signed)
[facility] EMERGENCY DEPARTMENT Provider Note   CSN: [id number] Arrival date & time: 11/10/20  1421     History Chief Complaint  Patient presents with  . Ankle Pain    Rodney Hopkins is a 45 y.o. male.  HPI Patient is a 45 y.o. male with a chief complaint of right ankle pain. Patient state he has a history of an injury to this foot in the past from a basketball injury and that he feels he pivoted on the foot to hard yesterday. He was able to bear weight initially but now is unable to due to swelling and pain. Denies fevers or chills, nausea or vomiting, syncope or SOB. Patient otherwise ambulatory prior to the event and in no acute distress at this time. Patient has not tried anything for symptom relief.   No past medical history on file.  There are no problems to display for this patient.   Past Surgical History:  Procedure Laterality Date  . SHOULDER SURGERY Left        No family history on file.  Social History   Tobacco Use  . Smoking status: Current Every Day Smoker    Types: Cigarettes  Substance Use Topics  . Alcohol use: Yes    Comment: social  . Drug use: No    Home Medications Prior to Admission medications   Medication Sig Start Date End Date Taking? Authorizing Provider  acetaminophen (TYLENOL) 500 MG tablet Take 1,000 mg by mouth every 6 (six) hours as needed. As needed for pain.     [provider]  amoxicillin (AMOXIL) 500 MG capsule Take 1 capsule (500 mg total) by mouth 3 (three) times daily. 01/10/19   Fransico Meadow, PA-C  diclofenac (VOLTAREN) 75 MG EC tablet Take 1 tablet (75 mg total) by mouth 2 (two) times daily. 01/10/19   Fransico Meadow, PA-C  EPINEPHrine (EPI-PEN) 0.3 mg/0.3 mL DEVI Inject 0.3 mLs (0.3 mg total) into the muscle once. 11/28/11   Harden Mo, MD  HYDROcodone-acetaminophen (NORCO/VICODIN) 5-325 MG per tablet Take 1-2 tablets by mouth every 6 (six) hours as needed. 06/26/14   Noe Gens, PA-C   meloxicam (MOBIC) 7.5 MG tablet Take 1 tablet (7.5 mg total) by mouth daily. Take 1-2 tabs once daily for 5 days, then daily as needed for pain 06/26/14   Noe Gens, PA-C  PredniSONE 5 MG KIT Take 1 kit (5 mg total) by mouth daily after breakfast. Prednisone 5 mg 6 day dosepack.  Take as directed. 11/28/11   Harden Mo, MD    Allergies    Patient has no known allergies.  Review of Systems   Review of Systems  Constitutional: Negative for chills and fever.  HENT: Negative for ear pain and sore throat.   Eyes: Negative for pain and visual disturbance.  Respiratory: Negative for cough and shortness of breath.   Cardiovascular: Negative for chest pain and palpitations.  Gastrointestinal: Negative for abdominal pain and vomiting.  Genitourinary: Negative for dysuria and hematuria.  Musculoskeletal: Positive for joint swelling. Negative for arthralgias and back pain.  Skin: Negative for color change and rash.  Neurological: Negative for seizures and syncope.  All other systems reviewed and are negative.   Physical Exam Updated Vital Signs BP (!) 144/104   Pulse 81   Temp 98.5 F (36.9 C)   Resp 16   SpO2 100%   Physical Exam Vitals and nursing note reviewed.  Constitutional:  Appearance: He is well-developed.  HENT:     Head: Normocephalic and atraumatic.  Eyes:     Conjunctiva/sclera: Conjunctivae normal.  Cardiovascular:     Rate and Rhythm: Normal rate and regular rhythm.     Heart sounds: No murmur heard.   Pulmonary:     Effort: Pulmonary effort is normal. No respiratory distress.     Breath sounds: Normal breath sounds.  Abdominal:     Palpations: Abdomen is soft.     Tenderness: There is no abdominal tenderness.  Musculoskeletal:        General: Swelling, tenderness and deformity present.     Cervical back: Neck supple.     Comments: Right ankle pain and swelling.  Skin:    General: Skin is warm and dry.  Neurological:     Mental Status: He is  alert.     ED Results / Procedures / Treatments   Labs (all labs ordered are listed, but only abnormal results are displayed) Labs Reviewed - No data to display  EKG None  Radiology DG Ankle Complete Right  Result Date: 11/10/2020 CLINICAL DATA:  Pain EXAM: RIGHT ANKLE - COMPLETE 3+ VIEW COMPARISON:  None. FINDINGS: Frontal, oblique, and lateral views were obtained. No acute fracture or joint effusion. No appreciable joint space narrowing or erosion. There are small foci of calcification within the ankle joint which may represent residua of prior trauma. Ankle mortise appears intact. IMPRESSION: No acute fracture. Small foci of calcification anteriorly and posteriorly in the ankle joint may represent residua of prior trauma. No joint space narrowing. Ankle mortise appears intact. Electronically Signed   By: Lowella Grip III M.D.   On: 11/10/2020 15:18   Medications Ordered in ED Medications - No data to display  ED Course  I have reviewed the triage vital signs and the nursing notes.  Pertinent labs & imaging results that were available during my care of the patient were reviewed by me and considered in my medical decision making (see chart for details).    MDM Rules/Calculators/A&P                          Patient is a 45 y.o. male presenting with ankle pain after an injury at basketball. XR demonstrates no acute abnormalities and the patient is neurovascularly at this time. On reassessment, patient able to ambulate with the assistance of crutches and is well appearing at this time.  Favor MSK sprain and patient encouraged to follow up with PCP if not improving.  Final Clinical Impression(s) / ED Diagnoses Final diagnoses:  None    Rx / DC Orders ED Discharge Orders    None       Tretha Sciara, MD 11/11/20 9021    Varney Biles, MD 11/12/20 [phone]

## 2020-12-31 ENCOUNTER — Emergency Department (HOSPITAL_COMMUNITY): Payer: Self-pay

## 2020-12-31 ENCOUNTER — Other Ambulatory Visit: Payer: Self-pay

## 2020-12-31 ENCOUNTER — Encounter (HOSPITAL_COMMUNITY): Payer: Self-pay | Admitting: Emergency Medicine

## 2020-12-31 ENCOUNTER — Emergency Department (HOSPITAL_COMMUNITY)
Admission: EM | Admit: 2020-12-31 | Discharge: 2020-12-31 | Disposition: A | Discharge status: Home / Self Care | Payer: Self-pay | Attending: Emergency Medicine | Admitting: Emergency Medicine

## 2020-12-31 DIAGNOSIS — R0602 Shortness of breath: Secondary | ICD-10-CM | POA: Insufficient documentation

## 2020-12-31 DIAGNOSIS — R0789 Other chest pain: Secondary | ICD-10-CM | POA: Insufficient documentation

## 2020-12-31 DIAGNOSIS — F1490 Cocaine use, unspecified, uncomplicated: Secondary | ICD-10-CM | POA: Insufficient documentation

## 2020-12-31 DIAGNOSIS — R202 Paresthesia of skin: Secondary | ICD-10-CM | POA: Insufficient documentation

## 2020-12-31 DIAGNOSIS — R079 Chest pain, unspecified: Secondary | ICD-10-CM

## 2020-12-31 DIAGNOSIS — F17210 Nicotine dependence, cigarettes, uncomplicated: Secondary | ICD-10-CM | POA: Insufficient documentation

## 2020-12-31 LAB — TROPONIN I (HIGH SENSITIVITY)
Troponin I (High Sensitivity): <2 ng/L (ref <18)
Troponin I (High Sensitivity): <2 ng/L (ref <18)

## 2020-12-31 LAB — CBC
HCT: 43.7 % (ref 39.0–52.0)
Hemoglobin: 15 g/dL (ref 13.0–17.0)
MCH: 31.6 pg (ref 26.0–34.0)
MCHC: 34.3 g/dL (ref 30.0–36.0)
MCV: 92.2 fL (ref 80.0–100.0)
Platelets: 235 10*3/uL (ref 150–400)
RBC: 4.74 MIL/uL (ref 4.22–5.81)
RDW: 12.5 % (ref 11.5–15.5)
WBC: 4.2 10*3/uL (ref 4.0–10.5)
nRBC: 0 % (ref 0.0–0.2)

## 2020-12-31 LAB — BASIC METABOLIC PANEL
Anion gap: 11 (ref 5–15)
BUN: 18 mg/dL (ref 6–20)
CO2: 23 mmol/L (ref 22–32)
Calcium: 9.6 mg/dL (ref 8.9–10.3)
Chloride: 105 mmol/L (ref 98–111)
Creatinine, Ser: 0.94 mg/dL (ref 0.61–1.24)
GFR, Estimated: >60 mL/min (ref >60)
Glucose, Bld: 88 mg/dL (ref 70–99)
Potassium: 4.2 mmol/L (ref 3.5–5.1)
Sodium: 139 mmol/L (ref 135–145)

## 2020-12-31 MED ORDER — ASPIRIN 81 MG PO CHEW
324.0000 mg | CHEWABLE_TABLET | Freq: Once | ORAL | Status: AC
  Administered 2020-12-31: 324 mg via ORAL
  Filled 2020-12-31: qty 4

## 2020-12-31 MED ORDER — NITROGLYCERIN 0.4 MG SL SUBL
0.4000 mg | SUBLINGUAL_TABLET | SUBLINGUAL | Status: AC | PRN
  Administered 2020-12-31 (×3): 0.4 mg via SUBLINGUAL
  Filled 2020-12-31 (×2): qty 1

## 2020-12-31 NOTE — ED Triage Notes (Signed)
C/o L sided chest pain x 5 days with dizziness.  Reports R arm numbness today.  Denies SOB, nausea, and vomiting.  Taking Pepcid without relief.

## 2020-12-31 NOTE — ED Notes (Signed)
Pt has left room and apparently left ED. MD notiffied

## 2020-12-31 NOTE — ED Provider Notes (Signed)
[facility] EMERGENCY DEPARTMENT Provider Note   CSN: [id number] Arrival date & time: 12/31/20  1426     History Chief Complaint  Patient presents with  . Chest Pain    Rodney Hopkins is a 45 y.o. male.  45 yo M who p/w CP. PT reports that 5d ago, he began having central, non-radiating CP that has been intermittent since it began. When asked if pain is sharp, squeezing, pressure, or tightness, patient states "all of them." Nothing makes pain better or worse, not associated w/ exertion. He reports feeling SOB when CP is happening. He had some R arm tingling on the way to the hospital.  He denies any cough/cold symptoms, nausea, vomiting, or abdominal pain.  He does admit to regular cocaine use, has been using cocaine over the past 5 days.  He took Pepcid without relief.  He smokes a few cigarettes per day.  No family history of heart disease.  No recent travel, leg swelling, or history of blood clots.  The history is provided by the patient.  Chest Pain      History reviewed. No pertinent past medical history.  There are no problems to display for this patient.   Past Surgical History:  Procedure Laterality Date  . SHOULDER SURGERY Left        No family history on file.  Social History   Tobacco Use  . Smoking status: Current Every Day Smoker    Types: Cigarettes  Substance Use Topics  . Alcohol use: Yes    Comment: social  . Drug use: No    Home Medications Prior to Admission medications   Medication Sig Start Date End Date Taking? Authorizing Provider  acetaminophen (TYLENOL) 500 MG tablet Take 1,000 mg by mouth every 6 (six) hours as needed. As needed for pain.   Yes [provider]  famotidine (PEPCID) 20 MG tablet Take 20 mg by mouth as needed for heartburn or indigestion.   Yes [provider]  amoxicillin (AMOXIL) 500 MG capsule Take 1 capsule (500 mg total) by mouth 3 (three) times daily. Patient not taking: Reported  on 12/31/2020 01/10/19   Fransico Meadow, PA-C  diclofenac (VOLTAREN) 75 MG EC tablet Take 1 tablet (75 mg total) by mouth 2 (two) times daily. Patient not taking: Reported on 12/31/2020 01/10/19   Fransico Meadow, PA-C  EPINEPHrine (EPI-PEN) 0.3 mg/0.3 mL DEVI Inject 0.3 mLs (0.3 mg total) into the muscle once. Patient not taking: Reported on 12/31/2020 11/28/11   Harden Mo, MD  HYDROcodone-acetaminophen (NORCO/VICODIN) 5-325 MG per tablet Take 1-2 tablets by mouth every 6 (six) hours as needed. Patient not taking: Reported on 12/31/2020 06/26/14   Noe Gens, PA-C  meloxicam (MOBIC) 7.5 MG tablet Take 1 tablet (7.5 mg total) by mouth daily. Take 1-2 tabs once daily for 5 days, then daily as needed for pain Patient not taking: Reported on 12/31/2020 06/26/14   Noe Gens, PA-C  PredniSONE 5 MG KIT Take 1 kit (5 mg total) by mouth daily after breakfast. Prednisone 5 mg 6 day dosepack.  Take as directed. Patient not taking: Reported on 12/31/2020 11/28/11   Harden Mo, MD    Allergies    Patient has no known allergies.  Review of Systems   Review of Systems  Cardiovascular: Positive for chest pain.   All other systems reviewed and are negative except that which was mentioned in HPI  Physical Exam Updated Vital Signs BP 106/61  Pulse 60   Resp 14   Ht _0  (1.676 m)   Wt 77.1 kg   SpO2 98%   BMI 27.44 kg/m   Physical Exam Constitutional:      General: He is not in acute distress.    Appearance: Normal appearance.  HENT:     Head: Normocephalic and atraumatic.  Eyes:     Conjunctiva/sclera: Conjunctivae normal.  Cardiovascular:     Rate and Rhythm: Normal rate and regular rhythm.     Heart sounds: Normal heart sounds. No murmur heard.   Pulmonary:     Effort: Pulmonary effort is normal.     Breath sounds: Normal breath sounds.  Abdominal:     General: Abdomen is flat. Bowel sounds are normal. There is no distension.     Palpations: Abdomen is soft.      Tenderness: There is no abdominal tenderness.  Musculoskeletal:     Right lower leg: No edema.     Left lower leg: No edema.  Skin:    General: Skin is warm and dry.  Neurological:     Mental Status: He is alert and oriented to person, place, and time.     Comments: fluent  Psychiatric:        Mood and Affect: Mood normal.        Behavior: Behavior normal.     ED Results / Procedures / Treatments   Labs (all labs ordered are listed, but only abnormal results are displayed) Labs Reviewed  BASIC METABOLIC PANEL  CBC  TROPONIN I (HIGH SENSITIVITY)  TROPONIN I (HIGH SENSITIVITY)    EKG EKG Interpretation  Date/Time:  Saturday Dec 31 2020 14:36:13 EDT Ventricular Rate:  94 PR Interval:  126 QRS Duration: 92 QT Interval:  344 QTC Calculation: 430 R Axis:   58 Text Interpretation: Normal sinus rhythm Minimal voltage criteria for LVH, may be normal variant ( Sokolow-Lyon ) Nonspecific ST and T wave abnormality Abnormal ECG noprevious tracing available Confirmed by Theotis Burrow [phone]) on 12/31/2020 3:30:28 PM   Radiology DG Chest 2 View  Result Date: 12/31/2020 CLINICAL DATA:  Chest pain. EXAM: CHEST - 2 VIEW COMPARISON:  None. FINDINGS: Cardiomediastinal silhouette is normal. Mediastinal contours appear intact. There is no evidence of focal airspace consolidation, pleural effusion or pneumothorax. Osseous structures are without acute abnormality. Postsurgical changes in the left chest wall. Punctate metallic densities also overlie the left thorax. IMPRESSION: No active cardiopulmonary disease. Electronically Signed   By: Fidela Salisbury M.D.   On: 12/31/2020 15:23    Procedures Procedures   Medications Ordered in ED Medications  aspirin chewable tablet 324 mg (324 mg Oral Given 12/31/20 1608)  nitroGLYCERIN (NITROSTAT) SL tablet 0.4 mg (0.4 mg Sublingual Given 12/31/20 1758)    ED Course  I have reviewed the triage vital signs and the nursing notes.  Pertinent  labs & imaging results that were available during my care of the patient were reviewed by me and considered in my medical decision making (see chart for details).    MDM Rules/Calculators/A&P                         Alert and well-appearing on exam, hypertensive but otherwise reassuring vital signs.  EKG with some nonspecific T wave changes inferiorly and in precordial leads, no previous available for comparison.  Chest x-ray clear.  Gave aspirin and nitroglycerin as I suspect he may be having cocaine induced chest pain/coronary vasospasm.  Recommended  screening lab work including serial troponins.  He has no risk factors for PE and is PERC negative.  His description is not highly suggestive of aortic dissection.  Lab work shows normal CBC and BMP, negative serial troponins.  I was later informed by the patient's nurse that he had at some point eloped prior to second troponin resulting. Final Clinical Impression(s) / ED Diagnoses Final diagnoses:  Nonspecific chest pain  Cocaine use    Rx / DC Orders ED Discharge Orders    None       Dezyrae Kensinger, Wenda Overland, MD 12/31/20 [phone]

## 2020-12-31 NOTE — ED Notes (Signed)
Pt reports minimal pain relief after 3 sublingual nitro.

## 2021-01-01 ENCOUNTER — Emergency Department (HOSPITAL_COMMUNITY): Payer: Self-pay

## 2021-01-01 ENCOUNTER — Emergency Department (HOSPITAL_COMMUNITY)
Admission: EM | Admit: 2021-01-01 | Discharge: 2021-01-01 | Disposition: A | Discharge status: Home / Self Care | Payer: Self-pay | Attending: Emergency Medicine | Admitting: Emergency Medicine

## 2021-01-01 ENCOUNTER — Encounter (HOSPITAL_COMMUNITY): Payer: Self-pay | Admitting: Emergency Medicine

## 2021-01-01 DIAGNOSIS — F1490 Cocaine use, unspecified, uncomplicated: Secondary | ICD-10-CM | POA: Diagnosis present

## 2021-01-01 DIAGNOSIS — R079 Chest pain, unspecified: Secondary | ICD-10-CM

## 2021-01-01 DIAGNOSIS — R0789 Other chest pain: Secondary | ICD-10-CM | POA: Insufficient documentation

## 2021-01-01 DIAGNOSIS — F17210 Nicotine dependence, cigarettes, uncomplicated: Secondary | ICD-10-CM | POA: Insufficient documentation

## 2021-01-01 DIAGNOSIS — Z789 Other specified health status: Secondary | ICD-10-CM | POA: Diagnosis present

## 2021-01-01 DIAGNOSIS — R202 Paresthesia of skin: Secondary | ICD-10-CM | POA: Insufficient documentation

## 2021-01-01 DIAGNOSIS — F1590 Other stimulant use, unspecified, uncomplicated: Secondary | ICD-10-CM | POA: Insufficient documentation

## 2021-01-01 LAB — CBC
HCT: 43.1 % (ref 39.0–52.0)
Hemoglobin: 14.5 g/dL (ref 13.0–17.0)
MCH: 31 pg (ref 26.0–34.0)
MCHC: 33.6 g/dL (ref 30.0–36.0)
MCV: 92.1 fL (ref 80.0–100.0)
Platelets: 240 10*3/uL (ref 150–400)
RBC: 4.68 MIL/uL (ref 4.22–5.81)
RDW: 12.3 % (ref 11.5–15.5)
WBC: 4.5 10*3/uL (ref 4.0–10.5)
nRBC: 0 % (ref 0.0–0.2)

## 2021-01-01 LAB — RAPID URINE DRUG SCREEN, HOSP PERFORMED
Amphetamines: NOT DETECTED
Barbiturates: POSITIVE — AB
Benzodiazepines: NOT DETECTED
Cocaine: POSITIVE — AB
Opiates: NOT DETECTED
Tetrahydrocannabinol: NOT DETECTED

## 2021-01-01 LAB — BASIC METABOLIC PANEL
Anion gap: 9 (ref 5–15)
BUN: 18 mg/dL (ref 6–20)
CO2: 27 mmol/L (ref 22–32)
Calcium: 9.3 mg/dL (ref 8.9–10.3)
Chloride: 102 mmol/L (ref 98–111)
Creatinine, Ser: 0.89 mg/dL (ref 0.61–1.24)
GFR, Estimated: >60 mL/min (ref >60)
Glucose, Bld: 147 mg/dL — ABNORMAL HIGH (ref 70–99)
Potassium: 4.1 mmol/L (ref 3.5–5.1)
Sodium: 138 mmol/L (ref 135–145)

## 2021-01-01 LAB — TROPONIN I (HIGH SENSITIVITY)
Troponin I (High Sensitivity): <2 ng/L (ref <18)
Troponin I (High Sensitivity): <2 ng/L (ref <18)

## 2021-01-01 MED ORDER — ALUM & MAG HYDROXIDE-SIMETH 200-200-20 MG/5ML PO SUSP
30.0000 mL | Freq: Once | ORAL | Status: AC
  Administered 2021-01-01: 30 mL via ORAL
  Filled 2021-01-01: qty 30

## 2021-01-01 MED ORDER — LIDOCAINE VISCOUS HCL 2 % MT SOLN
15.0000 mL | Freq: Once | OROMUCOSAL | Status: AC
  Administered 2021-01-01: 15 mL via ORAL
  Filled 2021-01-01: qty 15

## 2021-01-01 MED ORDER — CYCLOBENZAPRINE HCL 10 MG PO TABS: 10.0000 mg | ORAL_TABLET | Freq: Every evening | ORAL | 0 refills | Status: DC | PRN

## 2021-01-01 MED ORDER — SODIUM CHLORIDE 0.9 % IV BOLUS
500.0000 mL | Freq: Once | INTRAVENOUS | Status: AC
  Administered 2021-01-01: 500 mL via INTRAVENOUS

## 2021-01-01 MED ORDER — LORAZEPAM 2 MG/ML IJ SOLN
0.5000 mg | Freq: Once | INTRAMUSCULAR | Status: AC
  Administered 2021-01-01: 0.5 mg via INTRAVENOUS
  Filled 2021-01-01: qty 1

## 2021-01-01 MED ORDER — LIDOCAINE 5 % EX PTCH
1.0000 | MEDICATED_PATCH | CUTANEOUS | Status: DC
  Administered 2021-01-01: 1 via TRANSDERMAL
  Filled 2021-01-01: qty 1

## 2021-01-01 MED ORDER — IBUPROFEN 400 MG PO TABS
600.0000 mg | ORAL_TABLET | Freq: Once | ORAL | Status: AC
  Administered 2021-01-01: 600 mg via ORAL
  Filled 2021-01-01: qty 1

## 2021-01-01 NOTE — ED Provider Notes (Signed)
Emergency Medicine Provider Triage Evaluation Note  Rodney Hopkins , a 45 y.o. male  was evaluated in triage.  Pt complains of chest pain.  Review of Systems  Positive: Chest pain, sob Negative: Fever, productive cough  Physical Exam  BP (!) 144/104 (BP Location: Left Arm)   Pulse 86   Temp 98.1 F (36.7 C)   Resp 18   SpO2 100%  Gen:   Awake, no distress   Resp:  Normal effort  MSK:   Moves extremities without difficulty  Other:    Medical Decision Making  Medically screening exam initiated at 3:38 PM.  Appropriate orders placed.  Rodney Hopkins was informed that the remainder of the evaluation will be completed by another provider, this initial triage assessment does not replace that evaluation, and the importance of remaining in the ED until their evaluation is complete.  Was seen in the ER yesterday for CP, but eloped.  Pain still persist prompting return.    Rodney Helper, PA-C 01/01/21 1541    [name], MD 01/02/21 1003
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
## 2021-01-01 NOTE — ED Triage Notes (Signed)
C/o intermittent L sided chest pain x 6 days with dizziness.  Denies SOB, nausea, and vomiting.  Seen yesterday and LWBS.  Had been taking Pepcid without relief.

## 2021-01-01 NOTE — Discharge Instructions (Signed)
If your pain changes, becomes constant, you develop fevers, significant shortness of breath, leg swelling, or have any other concerns please seek additional medical care and evaluation.  Today you received medications that may make you sleepy or impair your ability to make decisions.  For the next 24 hours please do not drive, operate heavy machinery, care for a small child with out another adult present, or perform any activities that may cause harm to you or someone else if you were to fall asleep or be impaired.   You are being prescribed a medication which may make you sleepy. Please follow up of listed precautions for at least 24 hours after taking one dose.

## 2021-01-01 NOTE — ED Notes (Signed)
Patient verbalizes understanding of discharge instructions. Opportunity for questioning and answers were provided. Armband removed by staff, pt discharged from ED ambulatory.   

## 2021-01-01 NOTE — ED Notes (Signed)
PT given [place] sandwich bag

## 2022-07-07 IMAGING — CR DG ANKLE COMPLETE 3+V*R*
3 series · 3 of 3 positions shown · non-contrast
Comparison: None.

CLINICAL DATA: Pain

EXAM:
RIGHT ANKLE - COMPLETE 3+ VIEW

[ankle ap]
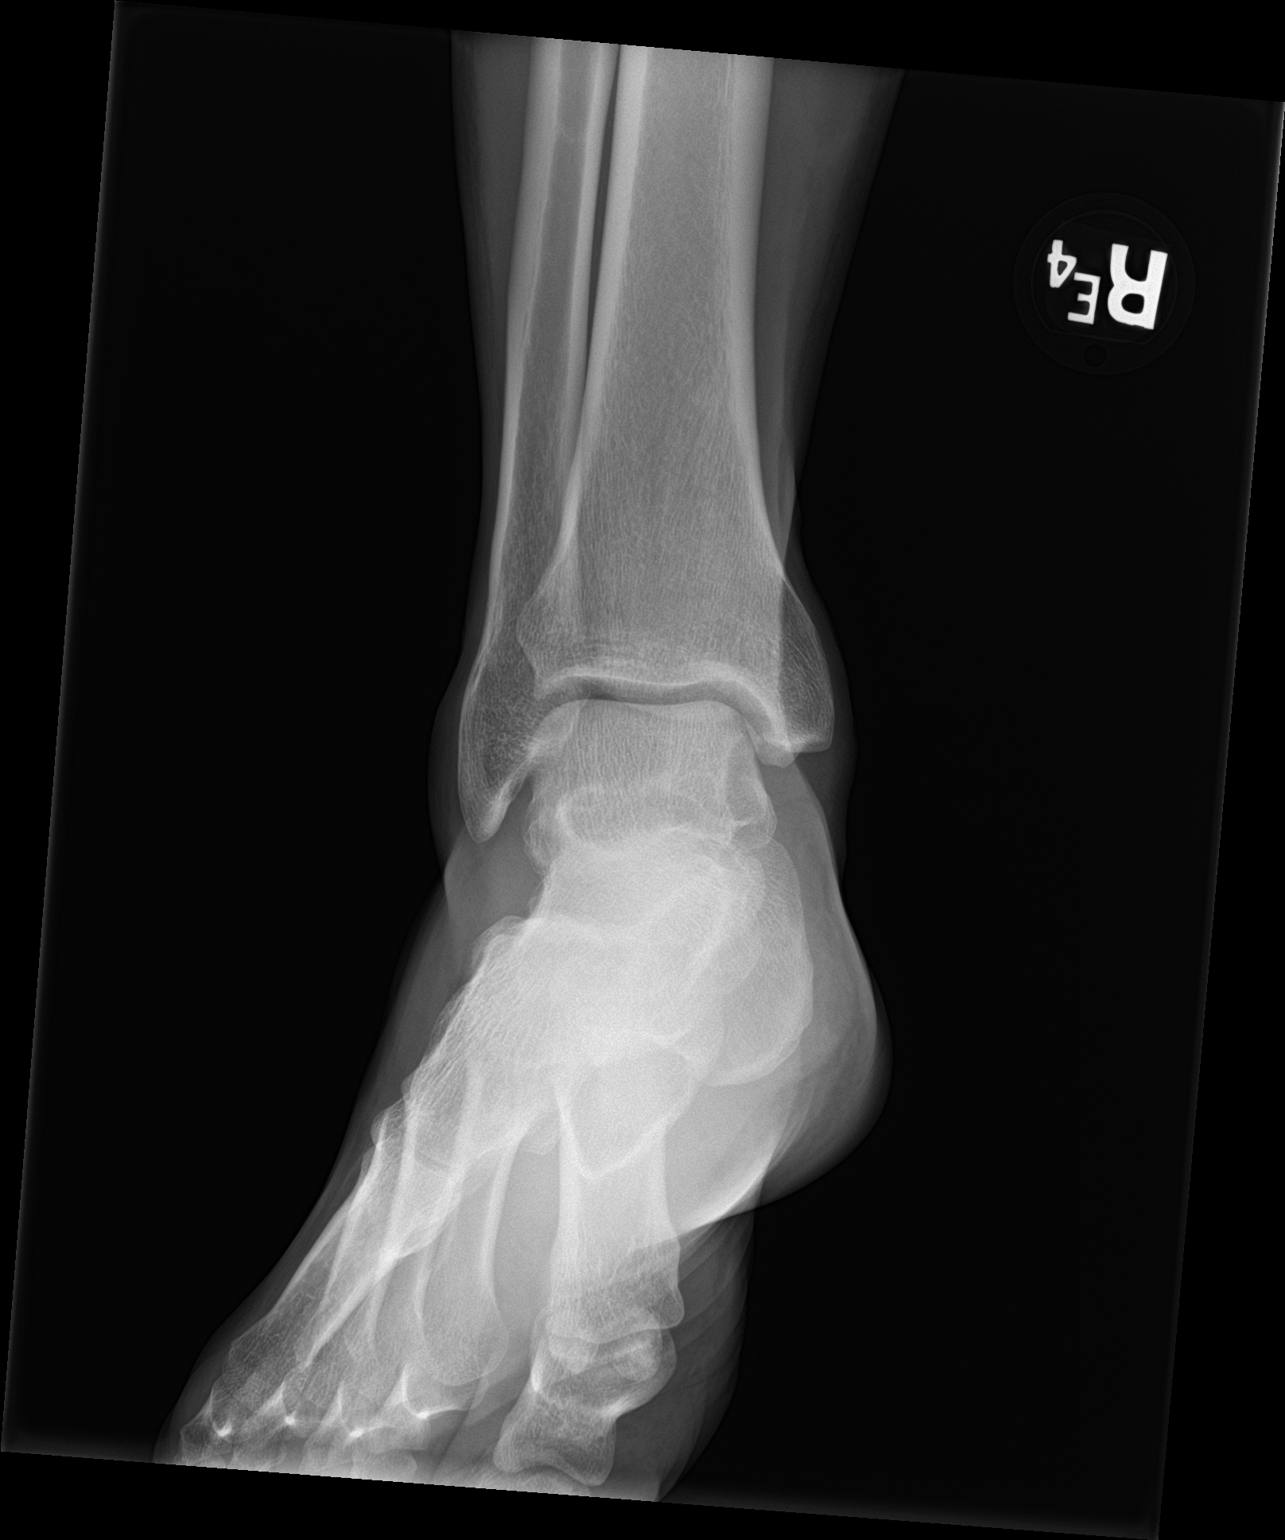

[ankle obl]
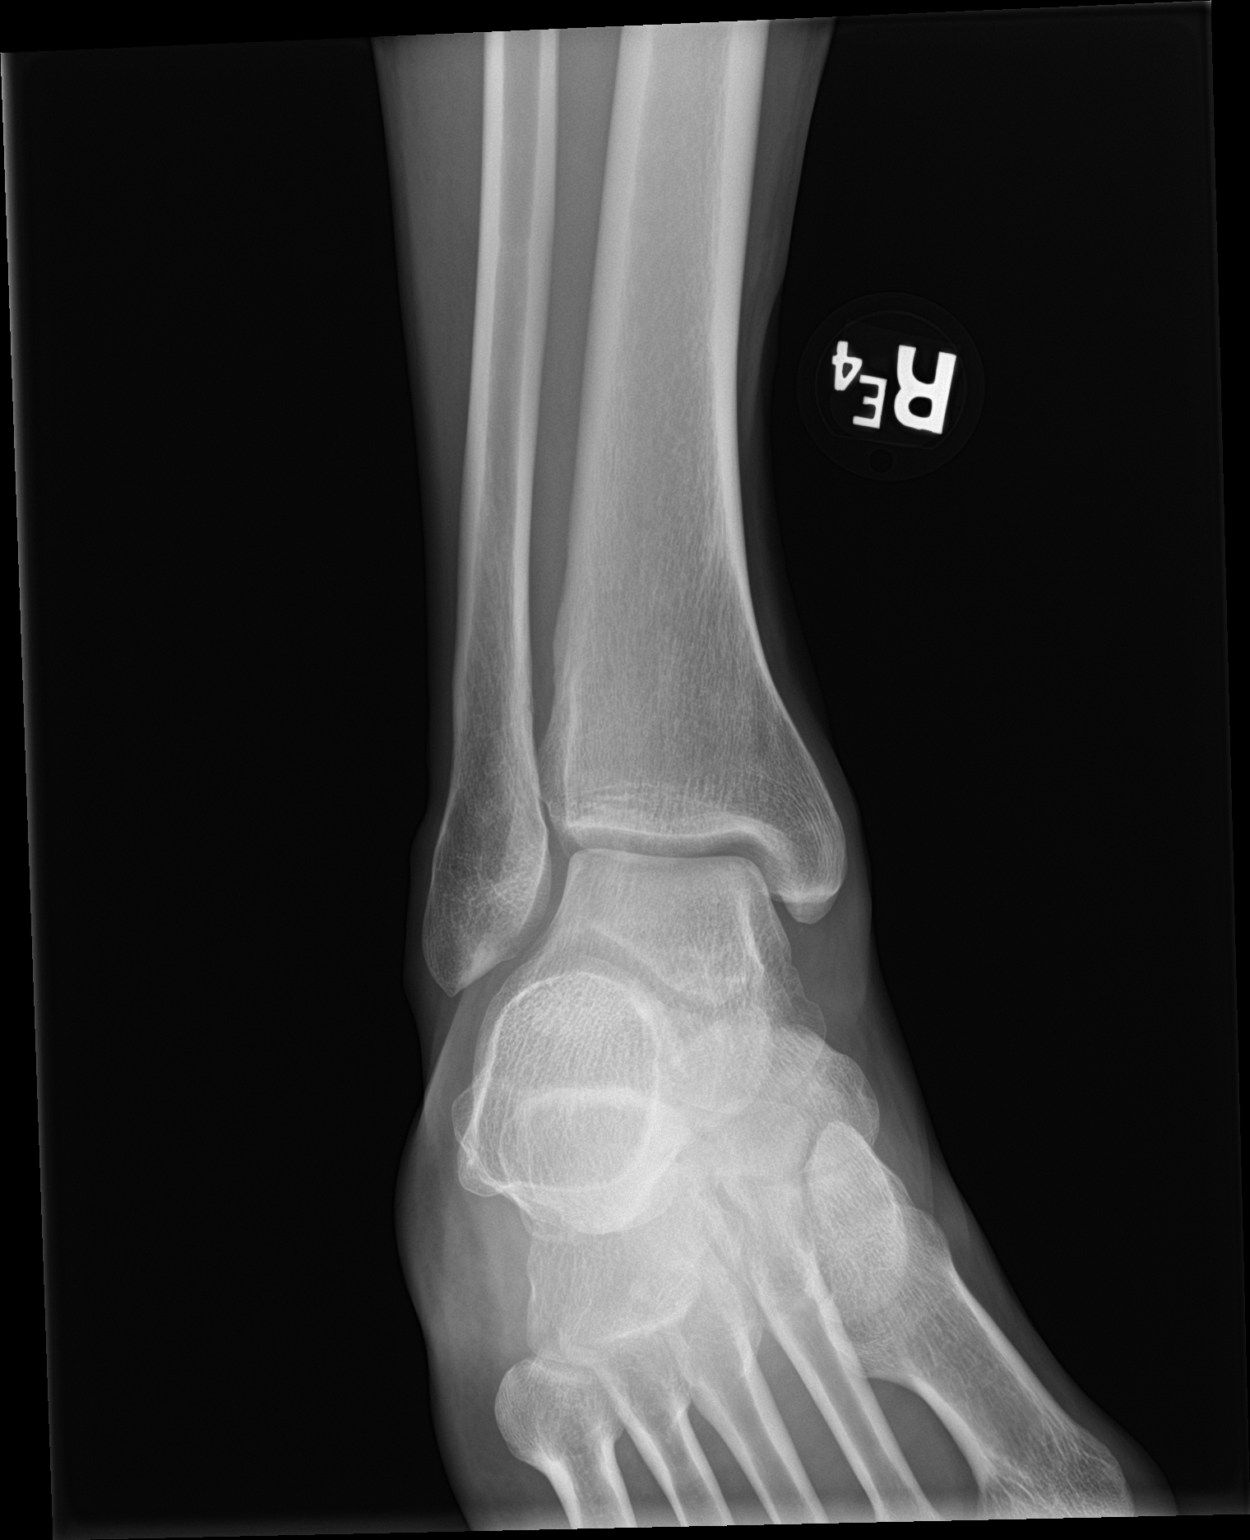

[ankle lat]
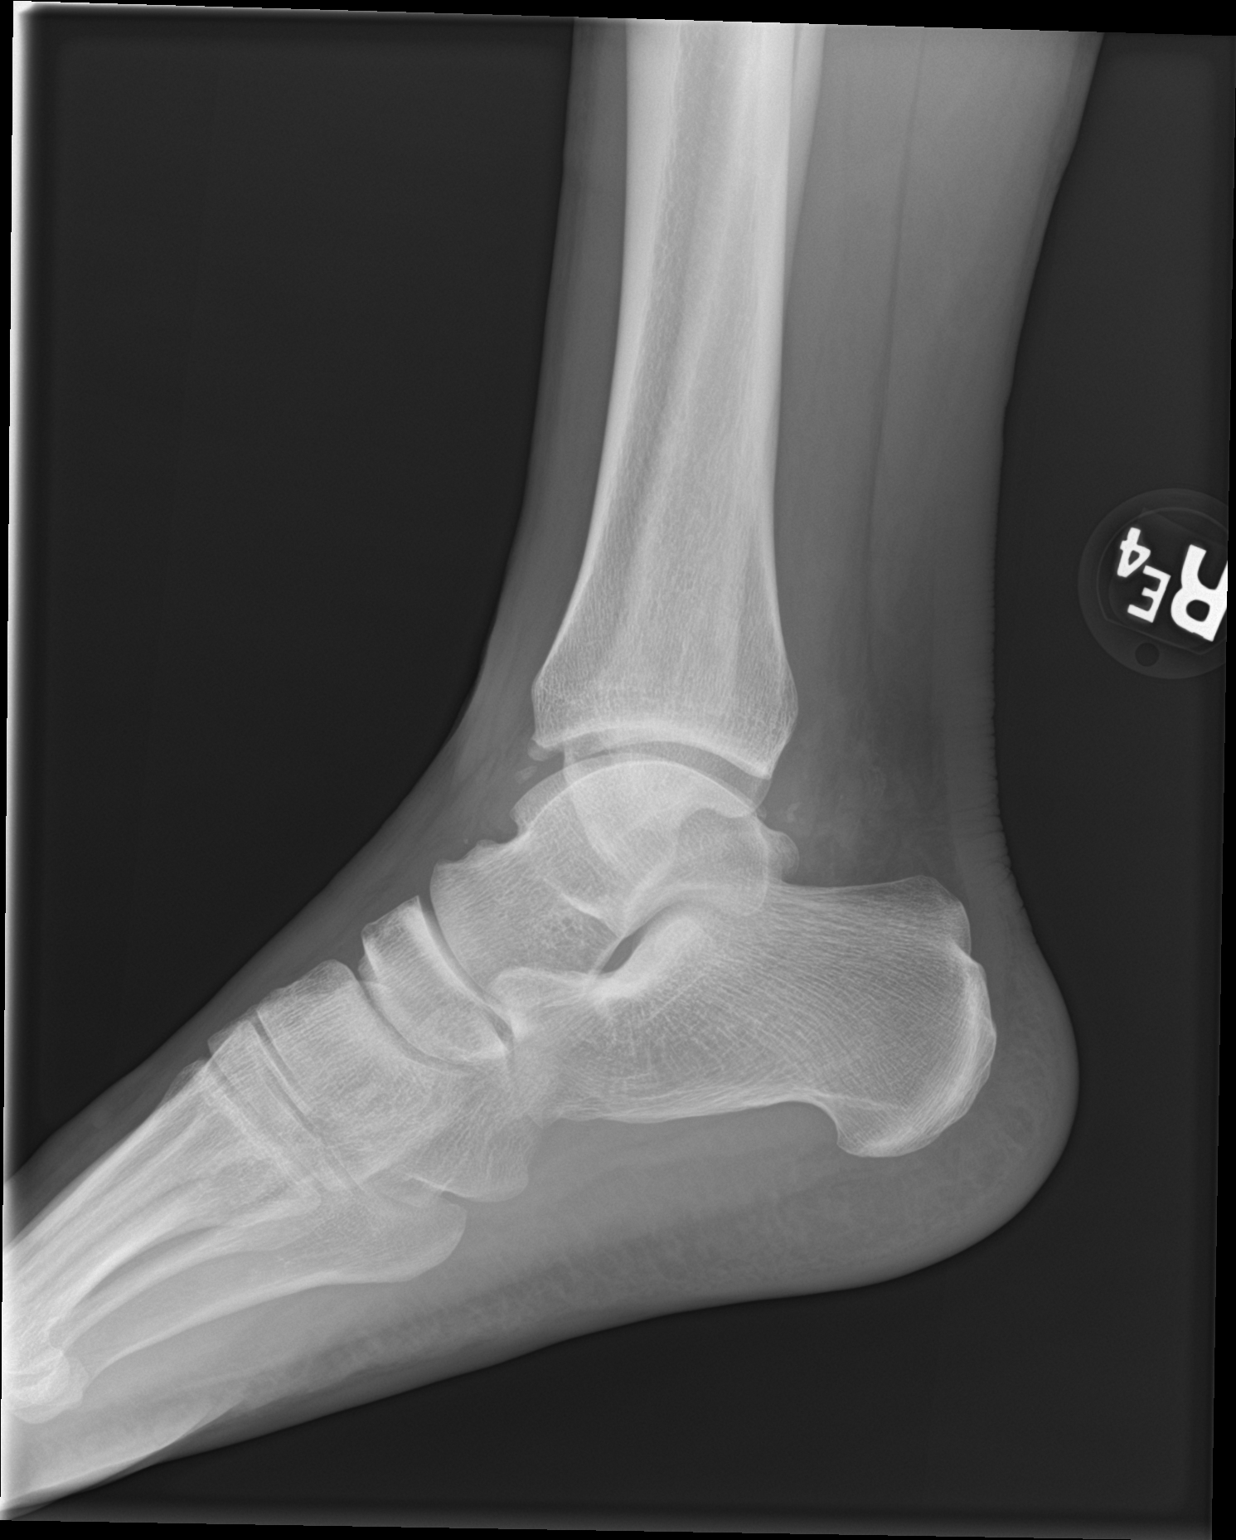

[3 of 3 positions shown; findings below may reference images not displayed]

FINDINGS: Frontal, oblique, and lateral views were obtained. No acute fracture
or joint effusion. No appreciable joint space narrowing or erosion.
There are small foci of calcification within the ankle joint which
may represent residua of prior trauma. Ankle mortise appears intact.
IMPRESSION: No acute fracture. Small foci of calcification anteriorly and
posteriorly in the ankle joint may represent residua of prior
trauma. No joint space narrowing. Ankle mortise appears intact.

## 2022-08-27 IMAGING — DX DG CHEST 2V
2 series · 2 of 2 positions shown · non-contrast
Comparison: None.

CLINICAL DATA: Chest pain.

EXAM:
CHEST - 2 VIEW

[chest pa]
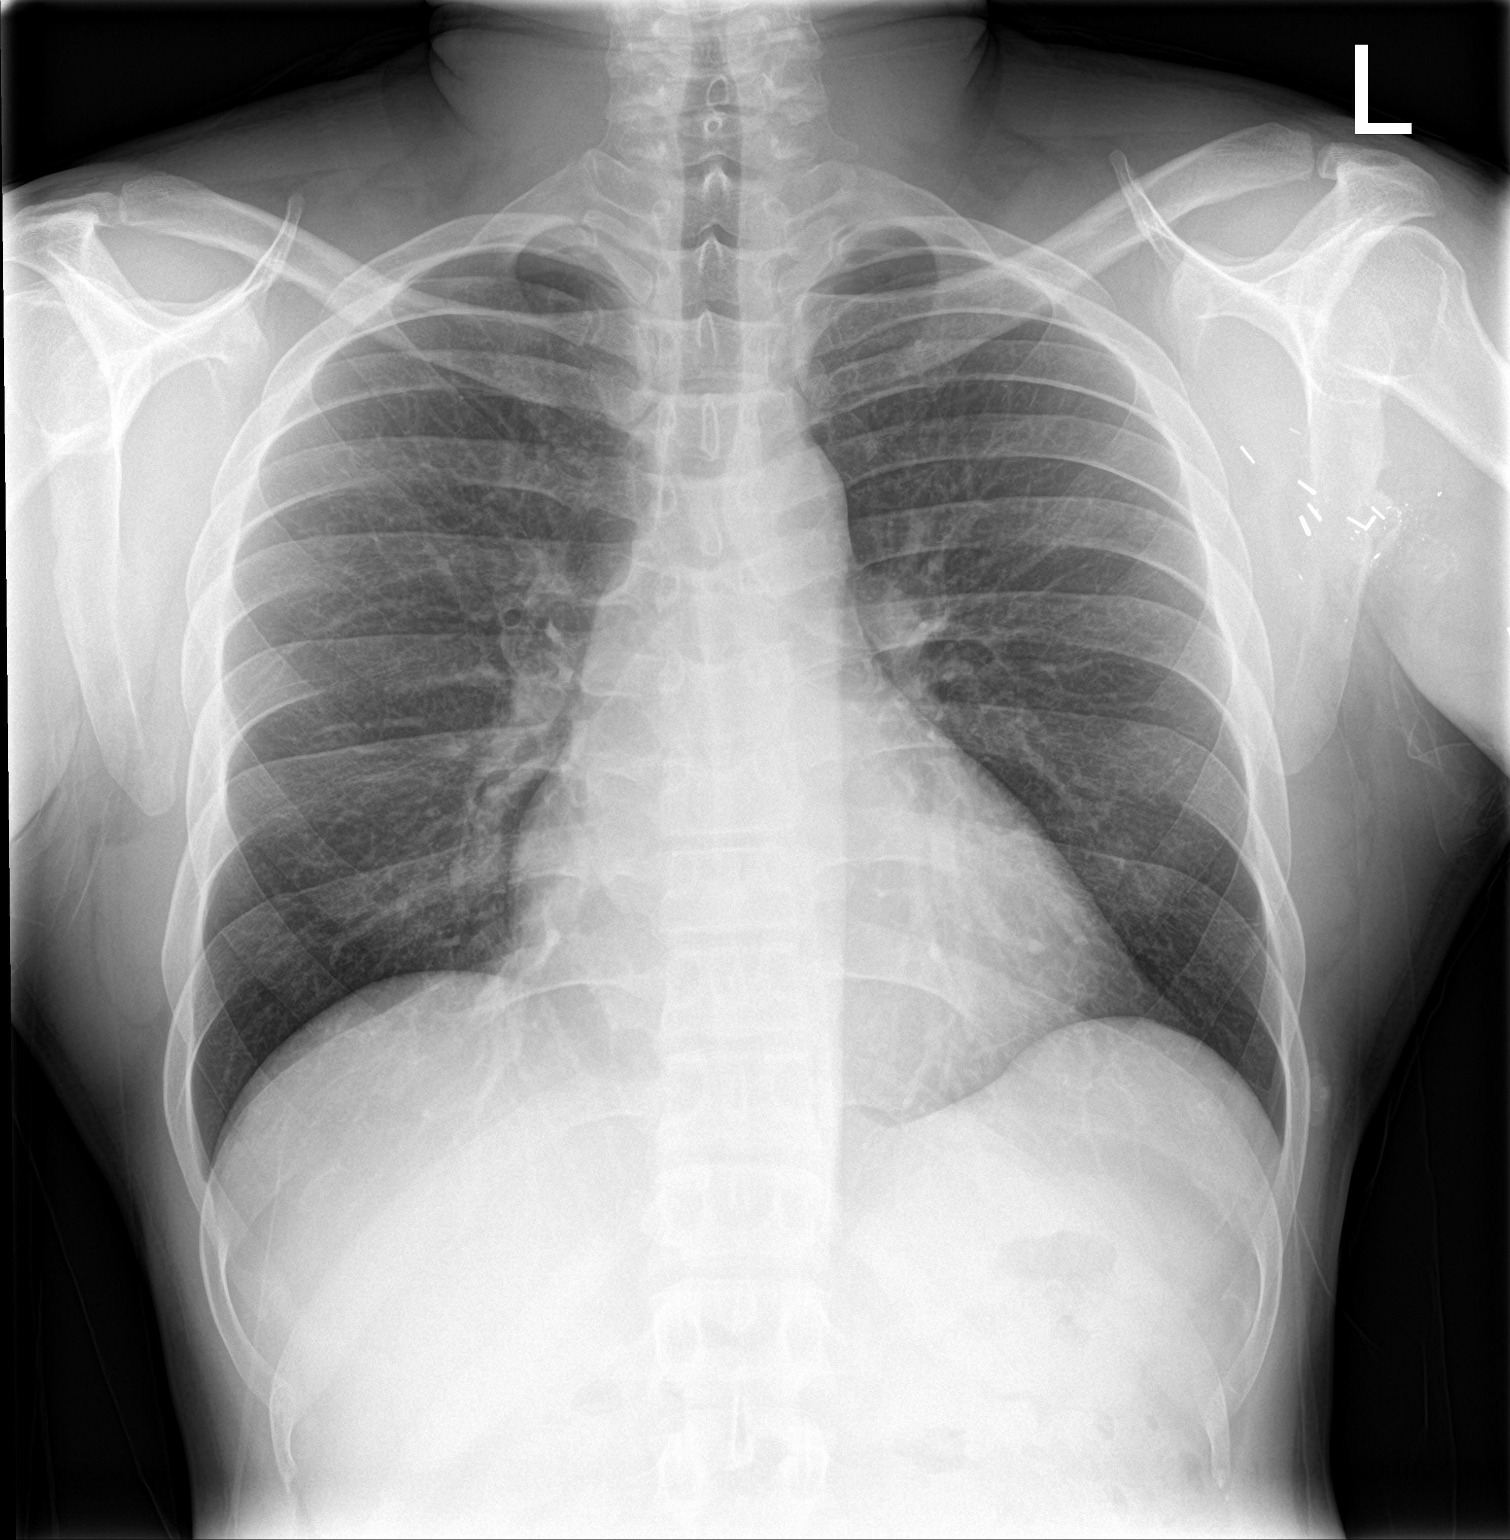

[chest lat]
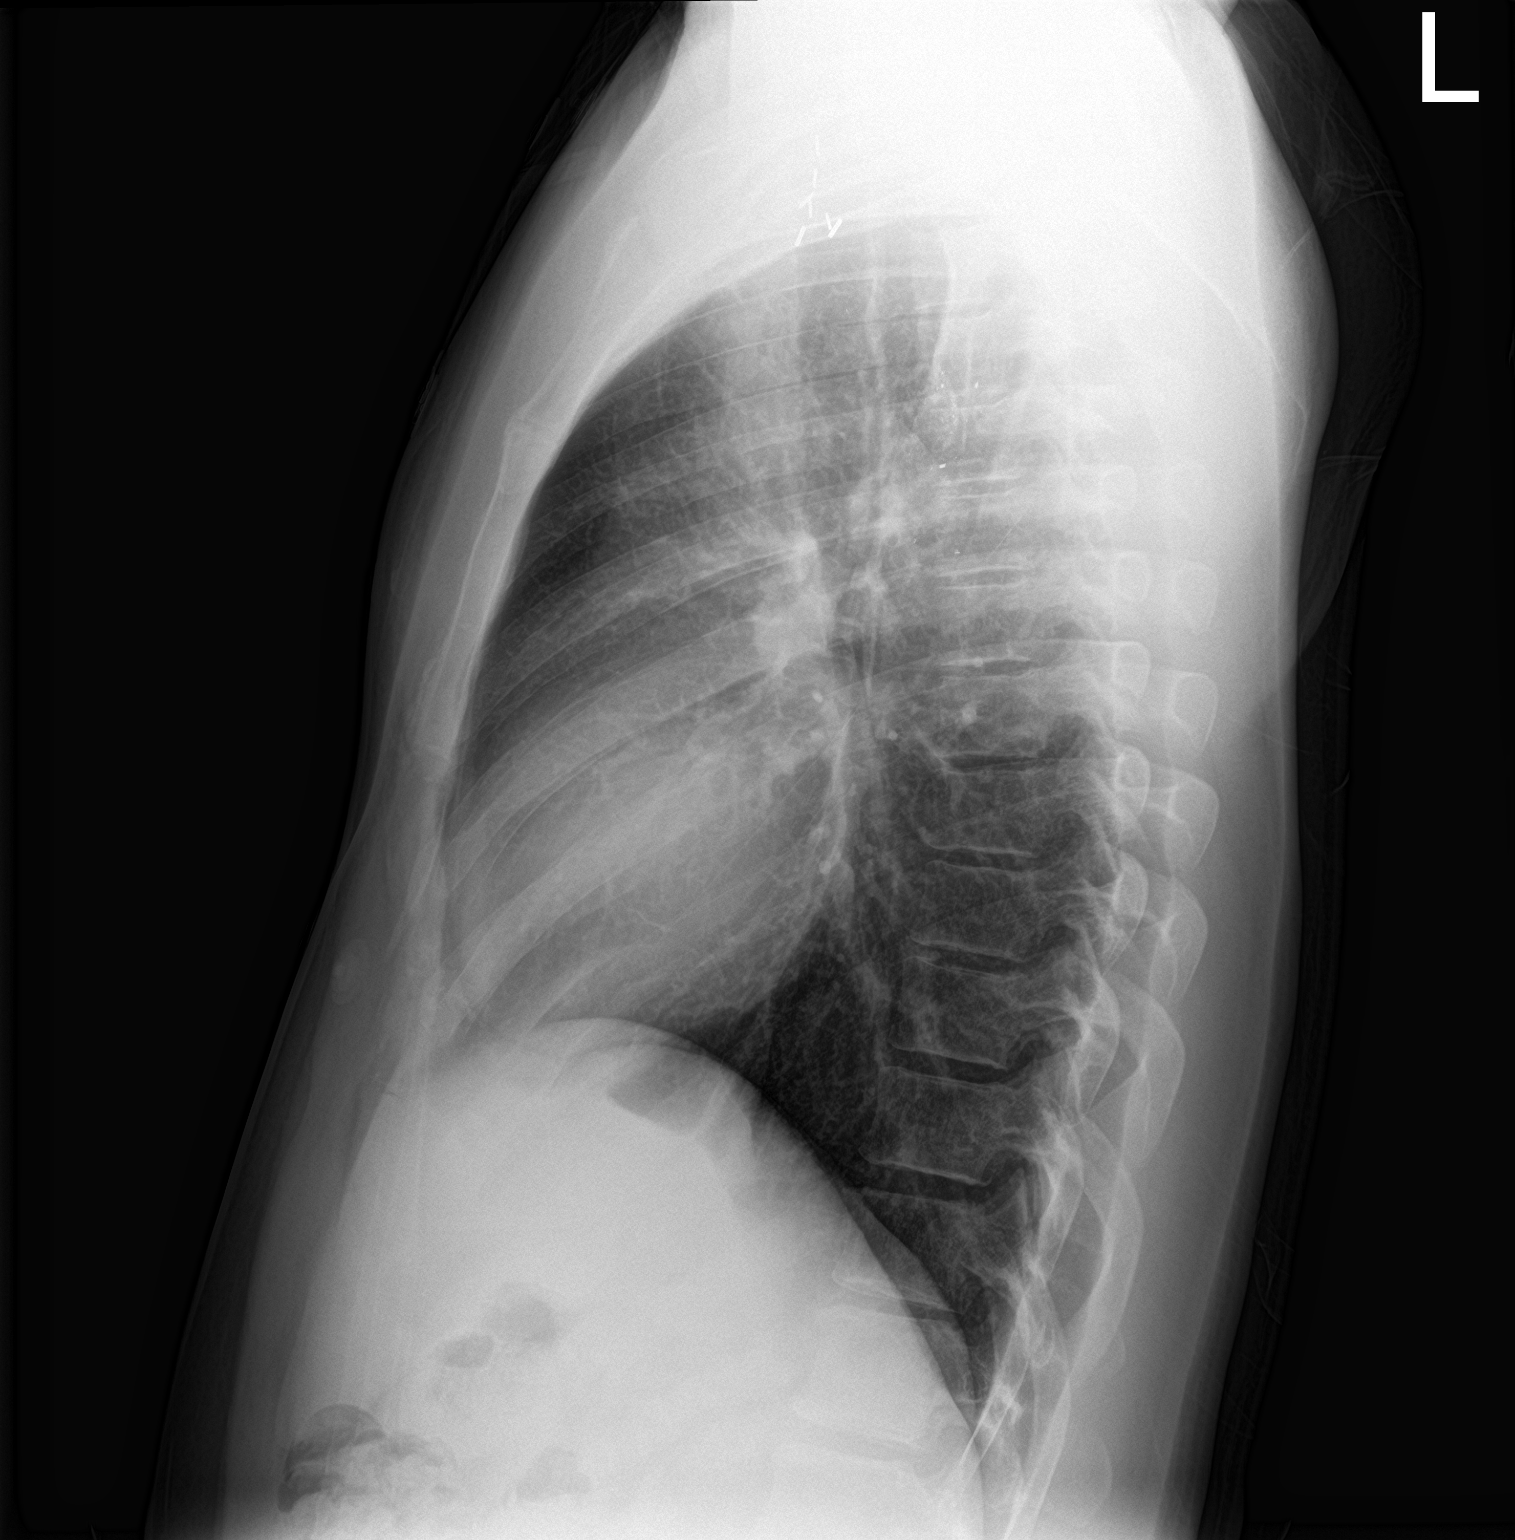

[2 of 2 positions shown; findings below may reference images not displayed]

FINDINGS: Cardiomediastinal silhouette is normal. Mediastinal contours appear
intact.

There is no evidence of focal airspace consolidation, pleural
effusion or pneumothorax.

Osseous structures are without acute abnormality.

Postsurgical changes in the left chest wall. Punctate metallic
densities also overlie the left thorax.
IMPRESSION: No active cardiopulmonary disease.

## 2023-03-22 ENCOUNTER — Emergency Department (HOSPITAL_COMMUNITY)
Admission: EM | Admit: 2023-03-22 | Discharge: 2023-03-22 | Disposition: A | Discharge status: Home / Self Care | Payer: Self-pay | Attending: Emergency Medicine | Admitting: Emergency Medicine

## 2023-03-22 ENCOUNTER — Emergency Department (HOSPITAL_COMMUNITY): Payer: Self-pay

## 2023-03-22 ENCOUNTER — Encounter (HOSPITAL_COMMUNITY): Payer: Self-pay

## 2023-03-22 ENCOUNTER — Other Ambulatory Visit: Payer: Self-pay

## 2023-03-22 DIAGNOSIS — S41112A Laceration without foreign body of left upper arm, initial encounter: Secondary | ICD-10-CM

## 2023-03-22 DIAGNOSIS — S01312A Laceration without foreign body of left ear, initial encounter: Secondary | ICD-10-CM | POA: Insufficient documentation

## 2023-03-22 DIAGNOSIS — S51812A Laceration without foreign body of left forearm, initial encounter: Secondary | ICD-10-CM | POA: Insufficient documentation

## 2023-03-22 DIAGNOSIS — Z23 Encounter for immunization: Secondary | ICD-10-CM | POA: Insufficient documentation

## 2023-03-22 DIAGNOSIS — S01359A Open bite of unspecified ear, initial encounter: Secondary | ICD-10-CM

## 2023-03-22 DIAGNOSIS — S51012A Laceration without foreign body of left elbow, initial encounter: Secondary | ICD-10-CM | POA: Insufficient documentation

## 2023-03-22 MED ORDER — ACETAMINOPHEN 500 MG PO TABS: 1000.0000 mg | ORAL_TABLET | Freq: Four times a day (QID) | ORAL | 0 refills | Status: AC | PRN

## 2023-03-22 MED ORDER — AMOXICILLIN-POT CLAVULANATE 875-125 MG PO TABS: 1.0000 | ORAL_TABLET | Freq: Two times a day (BID) | ORAL | 0 refills | Status: DC

## 2023-03-22 MED ORDER — IBUPROFEN 800 MG PO TABS
800.0000 mg | ORAL_TABLET | Freq: Once | ORAL | Status: AC
  Administered 2023-03-22: 800 mg via ORAL
  Filled 2023-03-22: qty 1

## 2023-03-22 MED ORDER — LIDOCAINE-EPINEPHRINE (PF) 2 %-1:200000 IJ SOLN
10.0000 mL | Freq: Once | INTRAMUSCULAR | Status: AC
  Administered 2023-03-22: 10 mL
  Filled 2023-03-22: qty 20

## 2023-03-22 MED ORDER — TETANUS-DIPHTH-ACELL PERTUSSIS 5-2.5-18.5 LF-MCG/0.5 IM SUSY
0.5000 mL | PREFILLED_SYRINGE | Freq: Once | INTRAMUSCULAR | Status: AC
  Administered 2023-03-22: 0.5 mL via INTRAMUSCULAR
  Filled 2023-03-22: qty 0.5

## 2023-03-22 NOTE — Discharge Instructions (Addendum)
You were seen in the emergency department for your multiple lacerations.   We have closed your laceration(s) with sutures. These need to be removed in 10 days. This can be done at any doctor's office, urgent care, or emergency department.   If any of the sutures come out before it is time for removal, that is okay. Make sure to keep the area as clean and dry as possible. You can let warm soapy warm run over the area, but do NOT scrub it.   Given that the laceration on your ear is from a bite, this has an increased risk of an infection and therefore I have given you a prescription for Augmentin which is an antibiotic for you to fill and take as prescribed in its entirety to help prevent any infection.  Watch out for signs of infection, like we discussed, including: increased redness, tenderness, or drainage of pus from the area. If this happens, please seek medical attention for possible infection.   You can take over the counter pain medicine like ibuprofen or tylenol as needed.   Return if development of any new or worsening symptoms.

## 2023-03-22 NOTE — ED Notes (Signed)
Patient left before discharge process could be completed. Unable to get a final/ updated set of vitals.
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
## 2023-04-03 ENCOUNTER — Encounter (HOSPITAL_COMMUNITY): Payer: Self-pay

## 2023-04-03 ENCOUNTER — Other Ambulatory Visit: Payer: Self-pay

## 2023-04-03 ENCOUNTER — Emergency Department (HOSPITAL_COMMUNITY)
Admission: EM | Admit: 2023-04-03 | Discharge: 2023-04-03 | Disposition: A | Discharge status: Home / Self Care | Payer: Self-pay | Attending: Emergency Medicine | Admitting: Emergency Medicine

## 2023-04-03 DIAGNOSIS — Z4802 Encounter for removal of sutures: Secondary | ICD-10-CM | POA: Insufficient documentation

## 2023-04-03 NOTE — ED Triage Notes (Addendum)
Pt coming in today to have stiches removed from left elbow and ear.
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
## 2023-04-03 NOTE — Discharge Instructions (Signed)
You had your stiches removed today, please apply bacitracin or neosporin to help with wound healing.

## 2023-06-03 ENCOUNTER — Emergency Department (HOSPITAL_COMMUNITY): Payer: Self-pay

## 2023-06-03 ENCOUNTER — Encounter (HOSPITAL_COMMUNITY): Payer: Self-pay | Admitting: Emergency Medicine

## 2023-06-03 ENCOUNTER — Emergency Department (HOSPITAL_COMMUNITY)
Admission: EM | Admit: 2023-06-03 | Discharge: 2023-06-03 | Disposition: A | Discharge status: Home / Self Care | Payer: Self-pay

## 2023-06-03 ENCOUNTER — Other Ambulatory Visit: Payer: Self-pay

## 2023-06-03 DIAGNOSIS — R202 Paresthesia of skin: Secondary | ICD-10-CM | POA: Insufficient documentation

## 2023-06-03 DIAGNOSIS — R2232 Localized swelling, mass and lump, left upper limb: Secondary | ICD-10-CM | POA: Insufficient documentation

## 2023-06-03 DIAGNOSIS — M7022 Olecranon bursitis, left elbow: Secondary | ICD-10-CM

## 2023-06-03 NOTE — ED Triage Notes (Signed)
Patient presents with left elbow swelling and pain. He also reports his fingers"move by themselves." Nothing has helped the pain or swelling.

## 2023-06-03 NOTE — ED Provider Notes (Signed)
[place] [facility]ERGENCY DEPARTMENT AT [facility] Provider Note   CSN: [id number] Arrival date & time: 06/03/23  1718     History  Chief Complaint  Patient presents with   Joint Swelling    Rodney Hopkins is a 47 y.o. male.  47 year old male presents emergency department with left elbow swelling times past 2 weeks.  Gradually worsening.  Mildly painful.  No fevers, chills, chest pain, shortness of breath.  He does endorse some cramping in hands and subjective paresthesias to the forearm and fingers.  However, he states that that has been ongoing for some time and from prior nerve damage        Home Medications Prior to Admission medications   Medication Sig Start Date End Date Taking? Authorizing Provider  acetaminophen (TYLENOL) 500 MG tablet Take 2 tablets (1,000 mg total) by mouth every 6 (six) hours as needed. As needed for pain. 03/22/23   Smoot, Shawn Route, PA-C  amoxicillin (AMOXIL) 500 MG capsule Take 1 capsule (500 mg total) by mouth 3 (three) times daily. Patient not taking: Reported on 12/31/2020 01/10/19   Elson Areas, PA-C  amoxicillin-clavulanate (AUGMENTIN) 875-125 MG tablet Take 1 tablet by mouth every 12 (twelve) hours. 03/22/23   Smoot, Shawn Route, PA-C  cyclobenzaprine (FLEXERIL) 10 MG tablet Take 1 tablet (10 mg total) by mouth at bedtime as needed for muscle spasms. 01/01/21   Cristina Gong, PA-C  diclofenac (VOLTAREN) 75 MG EC tablet Take 1 tablet (75 mg total) by mouth 2 (two) times daily. Patient not taking: Reported on 12/31/2020 01/10/19   Elson Areas, PA-C  EPINEPHrine (EPI-PEN) 0.3 mg/0.3 mL DEVI Inject 0.3 mLs (0.3 mg total) into the muscle once. Patient not taking: Reported on 12/31/2020 11/28/11   Reuben Likes, MD  famotidine (PEPCID) 20 MG tablet Take 20 mg by mouth as needed for heartburn or indigestion.    [provider]  HYDROcodone-acetaminophen (NORCO/VICODIN) 5-325 MG per tablet Take 1-2 tablets by mouth every 6 (six)  hours as needed. Patient not taking: Reported on 12/31/2020 06/26/14   Lurene Shadow, PA-C  meloxicam (MOBIC) 7.5 MG tablet Take 1 tablet (7.5 mg total) by mouth daily. Take 1-2 tabs once daily for 5 days, then daily as needed for pain Patient not taking: Reported on 12/31/2020 06/26/14   Lurene Shadow, PA-C  PredniSONE 5 MG KIT Take 1 kit (5 mg total) by mouth daily after breakfast. Prednisone 5 mg 6 day dosepack.  Take as directed. Patient not taking: Reported on 12/31/2020 11/28/11   Reuben Likes, MD      Allergies    Patient has no known allergies.    Review of Systems   Review of Systems  Physical Exam Updated Vital Signs BP (!) 140/95 (BP Location: Right Arm)   Pulse 84   Temp 98.2 F (36.8 C) (Oral)   Resp 18   SpO2 100%  Physical Exam Vitals and nursing note reviewed.  HENT:     Head: Normocephalic.     Nose: Nose normal.  Musculoskeletal:     Comments: Some swelling to the posterior elbow along olecranon process.  Full range of motion.  5 out of 5 strength in bicep flexion and extension.  Neurovascularly intact in the hand.  No tenderness to the area of swelling.  Skin:    General: Skin is warm.  Neurological:     Mental Status: He is alert and oriented to person, place, and time.  ED Results / Procedures / Treatments   Labs (all labs ordered are listed, but only abnormal results are displayed) Labs Reviewed - No data to display  EKG None  Radiology No results found.  Procedures Procedures    Medications Ordered in ED Medications - No data to display  ED Course/ Medical Decision Making/ A&P                                 Medical Decision Making Well-appearing 47 year old male presenting emergency department with left elbow swelling.  He is afebrile vital signs reassuring.  Physical exam consistent with bursitis.  Low suspicion for infectious etiology/septic joint.  Triage had ordered x-ray of elbow.  On my independent interpretation does not  appear to have acute osseous pathology or significant joint effusion.  Patient and significant other do not want to wait for x-ray results.  Feel this is reasonable given patient's overall clinical picture and exam consistent with bursitis.  With shared decision making discussed follow-up x-ray results on MyChart and trial of supportive medications Tylenol/Motrin.  Patient agreeable to plan.  Return precautions given.  Amount and/or Complexity of Data Reviewed Radiology: ordered.          Final Clinical Impression(s) / ED Diagnoses Final diagnoses:  None    Rx / DC Orders ED Discharge Orders     None         Coral Spikes, DO 06/03/23 1947

## 2023-06-03 NOTE — Discharge Instructions (Signed)
As discussed please follow-up to x-ray results on MyChart.  Return immediately felt fevers, chills, chest pain, shortness of breath, joint becomes red, hot, severe pain or any new or worsening symptoms that are concerning to you.

## 2023-06-03 NOTE — ED Notes (Signed)
Patient left before discharge process could be completed.  

## 2024-08-03 ENCOUNTER — Ambulatory Visit
Admission: EM | Admit: 2024-08-03 | Discharge: 2024-08-03 | Disposition: A | Discharge status: Home / Self Care | Payer: Self-pay | Attending: Family Medicine | Admitting: Family Medicine

## 2024-08-03 DIAGNOSIS — J111 Influenza due to unidentified influenza virus with other respiratory manifestations: Secondary | ICD-10-CM

## 2024-08-03 DIAGNOSIS — B349 Viral infection, unspecified: Secondary | ICD-10-CM

## 2024-08-03 LAB — POCT INFLUENZA A/B
Influenza A, POC: POSITIVE — AB
Influenza B, POC: NEGATIVE

## 2024-08-03 LAB — POC SOFIA SARS ANTIGEN FIA: SARS Coronavirus 2 Ag: NEGATIVE

## 2024-08-03 MED ORDER — ACETAMINOPHEN 325 MG PO TABS
650.0000 mg | ORAL_TABLET | Freq: Once | ORAL | Status: AC
  Administered 2024-08-03: 650 mg via ORAL

## 2024-08-03 MED ORDER — KETOROLAC TROMETHAMINE 30 MG/ML IJ SOLN
30.0000 mg | Freq: Once | INTRAMUSCULAR | Status: AC
  Administered 2024-08-03: 30 mg via INTRAMUSCULAR

## 2024-08-03 MED ORDER — KETOROLAC TROMETHAMINE 10 MG PO TABS: 10.0000 mg | ORAL_TABLET | Freq: Four times a day (QID) | ORAL | 0 refills | Status: AC | PRN

## 2024-08-03 MED ORDER — OSELTAMIVIR PHOSPHATE 75 MG PO CAPS: 75.0000 mg | ORAL_CAPSULE | Freq: Two times a day (BID) | ORAL | 0 refills | Status: AC

## 2024-08-03 NOTE — Discharge Instructions (Addendum)
 Your testing shows you have influenza A.  The COVID test is negative  You have been given a shot of Toradol  30 mg today.  Ketorolac  10 mg tablets--take 1 tablet every 6 hours as needed for pain.  This is the same medicine that is in the shot we just gave you  Take oseltamivir  75 mg--1 capsule 2 times daily for 5 days; this is the antiviral medicine to help the flu get better.

## 2024-08-03 NOTE — ED Triage Notes (Signed)
 Patient is having body aches, really bad headaches that is constant and feels like an electric shock,  cough .  It started 3 days ago.  Patient took ibphofen yesterday afternoon

## 2024-08-03 NOTE — ED Provider Notes (Addendum)
 " [facility]    CSN: [id number] Arrival date & time: 08/03/24  1057      History   Chief Complaint Chief Complaint  Patient presents with   Headache   Generalized Body Aches   Cough    HPI Rodney Hopkins is a 48 y.o. male.    Headache Associated symptoms: cough   Cough Associated symptoms: headaches    Here for myalgia and right sided headache and cough.  Symptoms began on December 26.  He has been taking some ibuprofen  but it has not helped, and he took about 200 mg at a time.  He cannot tell if he has any fever.  The headache is bothering him on the right side of his head near his ear and it is a lightening sharp pain comes and goes.  He does not feel any swelling of his mouth on the right.  He does not feel any pain in his teeth at this point.  NKDA  no nausea vomiting or diarrhea  Uncertain exposures.   No past medical history on file.  Patient Active Problem List   Diagnosis Date Noted   Cocaine use 01/01/2021   Caffeine use 01/01/2021    Past Surgical History:  Procedure Laterality Date   SHOULDER SURGERY Left        Home Medications    Prior to Admission medications  Medication Sig Start Date End Date Taking? Authorizing Provider  ketorolac  (TORADOL ) 10 MG tablet Take 1 tablet (10 mg total) by mouth every 6 (six) hours as needed (pain). 08/03/24  Yes Vonna Sharlet POUR, MD  oseltamivir  (TAMIFLU ) 75 MG capsule Take 1 capsule (75 mg total) by mouth every 12 (twelve) hours. 08/03/24  Yes Vonna Sharlet POUR, MD  acetaminophen  (TYLENOL ) 500 MG tablet Take 2 tablets (1,000 mg total) by mouth every 6 (six) hours as needed. As needed for pain. 03/22/23   Smoot, Lauraine LABOR, PA-C  EPINEPHrine  (EPI-PEN) 0.3 mg/0.3 mL DEVI Inject 0.3 mLs (0.3 mg total) into the muscle once. Patient not taking: Reported on 12/31/2020 11/28/11   Sonda Alm BROCKS, MD  famotidine (PEPCID) 20 MG tablet Take 20 mg by mouth as needed for heartburn or indigestion.     [provider]    Family History No family history on file.  Social History Social History[1]   Allergies   Patient has no known allergies.   Review of Systems Review of Systems  Respiratory:  Positive for cough.   Neurological:  Positive for headaches.     Physical Exam Triage Vital Signs ED Triage Vitals  Encounter Vitals Group     BP 08/03/24 1204 127/84     Girls Systolic BP Percentile --      Girls Diastolic BP Percentile --      Boys Systolic BP Percentile --      Boys Diastolic BP Percentile --      Pulse Rate 08/03/24 1204 80     Resp 08/03/24 1204 16     Temp 08/03/24 1204 98.4 F (36.9 C)     Temp Source 08/03/24 1204 Oral     SpO2 08/03/24 1204 96 %     Weight --      Height --      Head Circumference --      Peak Flow --      Pain Score 08/03/24 1202 10     Pain Loc --      Pain Education --  Exclude from Growth Chart --    No data found.  Updated Vital Signs BP 127/84 (BP Location: Left Arm)   Pulse 80   Temp 98.4 F (36.9 C) (Oral)   Resp 16   SpO2 96%   Visual Acuity Right Eye Distance:   Left Eye Distance:   Bilateral Distance:    Right Eye Near:   Left Eye Near:    Bilateral Near:     Physical Exam Vitals reviewed.  Constitutional:      General: He is not in acute distress.    Appearance: He is not toxic-appearing.  HENT:     Right Ear: Tympanic membrane and ear canal normal.     Left Ear: Tympanic membrane and ear canal normal.     Nose: Nose normal.     Mouth/Throat:     Mouth: Mucous membranes are moist.     Comments: There are some mild erythema of the tonsillar pillars.  Dentition have some caries.  No swelling in the mouth and no swelling of the cheek Eyes:     Extraocular Movements: Extraocular movements intact.     Conjunctiva/sclera: Conjunctivae normal.     Pupils: Pupils are equal, round, and reactive to light.  Cardiovascular:     Rate and Rhythm: Normal rate and regular rhythm.     Heart  sounds: No murmur heard. Pulmonary:     Effort: Pulmonary effort is normal. No respiratory distress.     Breath sounds: Normal breath sounds. No stridor. No wheezing, rhonchi or rales.  Musculoskeletal:     Cervical back: Neck supple.  Lymphadenopathy:     Cervical: No cervical adenopathy.  Skin:    Capillary Refill: Capillary refill takes less than 2 seconds.     Coloration: Skin is not jaundiced or pale.  Neurological:     General: No focal deficit present.     Mental Status: He is alert and oriented to person, place, and time.  Psychiatric:        Behavior: Behavior normal.      UC Treatments / Results  Labs (all labs ordered are listed, but only abnormal results are displayed) Labs Reviewed  POCT INFLUENZA A/B - Abnormal; Notable for the following components:      Result Value   Influenza A, POC Positive (*)    All other components within normal limits  POC SOFIA SARS ANTIGEN FIA    EKG   Radiology No results found.  Procedures Procedures (including critical care time)  Medications Ordered in UC Medications  ketorolac  (TORADOL ) 30 MG/ML injection 30 mg (has no administration in time range)  acetaminophen  (TYLENOL ) tablet 650 mg (650 mg Oral Given 08/03/24 1349)    Initial Impression / Assessment and Plan / UC Course  I have reviewed the triage vital signs and the nursing notes.  Pertinent labs & imaging results that were available during my care of the patient were reviewed by me and considered in my medical decision making (see chart for details).     Test for influenza A is positive.  COVID is negative.  Tamiflu  was sent in to treat. Toradol  injection is given here and Toradol  tablets are sent into the pharmacy for his headache.  This does not seem to be a dental infection causing his headache Final Clinical Impressions(s) / UC Diagnoses   Final diagnoses:  Viral illness  Flu     Discharge Instructions      Your testing shows you have  influenza A.  The COVID test is negative  You have been given a shot of Toradol  30 mg today.  Ketorolac  10 mg tablets--take 1 tablet every 6 hours as needed for pain.  This is the same medicine that is in the shot we just gave you  Take oseltamivir  75 mg--1 capsule 2 times daily for 5 days; this is the antiviral medicine to help the flu get better.      ED Prescriptions     Medication Sig Dispense Auth. Provider   oseltamivir  (TAMIFLU ) 75 MG capsule Take 1 capsule (75 mg total) by mouth every 12 (twelve) hours. 10 capsule Vonna Sharlet POUR, MD   ketorolac  (TORADOL ) 10 MG tablet Take 1 tablet (10 mg total) by mouth every 6 (six) hours as needed (pain). 20 tablet Nessa Ramaker K, MD      PDMP not reviewed this encounter.    Vonna Sharlet POUR, MD 08/03/24 1435     [1]  Social History Tobacco Use   Smoking status: Every Day    Types: Cigarettes  Substance Use Topics   Alcohol use: Yes    Comment: social   Drug use: Yes    Types: Marijuana     Vonna Sharlet POUR, MD 08/03/24 1436  "
